# Patient Record
Sex: Female | Born: 1950 | Race: White | Hispanic: No | Marital: Married
Health system: Midwestern US, Academic
[De-identification: ages and names within clinical notes are randomized; demographics above are authoritative.]

---

## 2016-09-17 ENCOUNTER — Ambulatory Visit: Admit: 2016-09-17 | Discharge: 2016-09-17 | Payer: 59

## 2016-09-17 ENCOUNTER — Encounter: Admit: 2016-09-17 | Discharge: 2016-09-17 | Payer: MEDICARE

## 2016-09-17 DIAGNOSIS — J309 Allergic rhinitis, unspecified: ICD-10-CM

## 2016-09-17 DIAGNOSIS — I10 Essential (primary) hypertension: Principal | ICD-10-CM

## 2016-09-17 DIAGNOSIS — Z889 Allergy status to unspecified drugs, medicaments and biological substances status: Principal | ICD-10-CM

## 2016-09-17 DIAGNOSIS — J329 Chronic sinusitis, unspecified: ICD-10-CM

## 2016-09-17 DIAGNOSIS — L508 Other urticaria: ICD-10-CM

## 2016-09-17 DIAGNOSIS — J4540 Moderate persistent asthma, uncomplicated: ICD-10-CM

## 2016-09-17 DIAGNOSIS — K219 Gastro-esophageal reflux disease without esophagitis: ICD-10-CM

## 2016-09-17 MED ORDER — FLUTICASONE 110 MCG/ACTUATION IN HFAA
2 | Freq: Every day | RESPIRATORY_TRACT | 11 refills | Status: AC
Start: 2016-09-17 — End: 2018-01-08

## 2016-09-17 NOTE — Progress Notes
Date of Service: 09/17/2016    Subjective:             Morgan Sharp is a 66 y.o. female with asthma/AERD, recurrent sinus infections, chronic urticaria, allergic rhinitis, and drug allergies who is here for follow up.    History of Present Illness    She had a reaction to PCN in her 3s in which she blacked out.  She has not been off her Zyrtec. If she skips her night time dose, she wakes up all congested and has eye symptoms.  She may consider testing in the winter.    She is taking Zantac 150 mg three times per day for the hives and not taking the Prevacid.  She states she has hives till in her scalp that don't bother her much She had gone a year or two without any, but she is still having them here and there.  She states that she has not had any visible hives on the body and maybe just a little red mark on her scalp. She is also on Zyrtec 10 mg twice daily and Singulair once daily.    She is only using Flovent 1 puff daily and Singulair. She only used albuterol 2 times over the winter that resolved with 1 puff of albuterol. There have been no UC/ER/unplanned PCP visits, nocturnal awakenings, or oral steroid use for asthma since the last visit.   She states she has no activity limitations. She does not have a spacer.  She has not traditionally used one.    She is still taking the Nasacort.  Her sense of smell is good.  She may still have some rhinorrhea, but is doing well with her symptoms.      She will need Pneumovax 09/25/16 as she had Prevnar on 10/25/16.    Rhinitis Control Assessment Test    1. During the past week, how often did you have nasal congestion?  1) Extremely often  2) Often  3) Sometimes  4) Rarely  5) Never    2. During the past week, how often did you sneeze?  1) Extremely often  2) Often  3) Sometimes  4) Rarely  5) Never    3. During the past week, how often did you have watery eyes?  1) Extremely often  2) Often  3) Sometimes  4) Rarely  5) Never 4. During the past week, to what extent did your nasal or other allergy symptoms interfere with your sleep?  1) All the time  2) A lot  3) Somewhat  4) A little  5) Not at all    5. During the past week, how often did you avoid any activities (for example visiting a house with a dog or cat, gardening) because of your nasal or other allergy symptoms?  1) Extremely often  2) Often  3) Sometimes  4) Rarely  5) Never    6. During the past week, how well were your nasal or other allergy symptoms controlled?  1) Not at all  2) A little  3) Somewhat  4) Very  5) Completely    Total Score: 27  A total score of 21 or less may indicate inadequate control of symptoms.     Asthma Control Test for people 12 years and older    1. In the past 4 weeks, how much of the time did your asthma keep you from getting as much done at work, school or at home?    1-  All of the time  2-  Most of the time  3-  Some of the time  4-  A little of the time  5-  None of the time    2. During the past 4 weeks, how often have you had shortness of breath?    1-  More than once a day  2-  Once a day  3-  3-6 times a week  4-  Once or twice a week  5-  Not at all    3. During the past 4 weeks, how often did your asthma symptoms (wheezing, coughing, shortness of breath, chest tightness or pain) wake you up at night or earlier than usual in the morning?    1-  4 or more night a week  2-  2-3 nights a week  3-  Once a week  4-  Once or twice  5-  Not at all    4. During the past 4 weeks, how often have you used your rescue inhaler or nebulizer medication (such as albuterol)?    1-  3 or more times per day  2-  1 or 2 times per day  3-  2 or 3 times per week  4-  Once a week or less  5-  Not at all    5. How would you rate your asthma control during the past 4 weeks?    1- Not controlled at all  2- Poorly controlled  3- Somewhat controlled  4- Well controlled  5- Completely controlled    ACT SCORE = 25 (A score of 19 or less might suggest that the patient's asthma is not as well controlled as it could be)         Review of Systems   HENT: Positive for sinus pressure.    Eyes: Positive for itching.   A 10 point review of systems has been reviewed and the remainder are all negative other than as noted above and as in the HPI.      Objective:         ??? ALBUTEROL SULFATE (VENTOLIN HFA IN) Inhale 1-2 Puffs by mouth as Needed.     ??? Cetirizine (ZYRTEC) 10 mg Cap Take 1 Cap by mouth twice daily.   ??? esomeprazole magnesium (NEXIUM PO) Take  by mouth.   ??? fluticasone (FLOVENT HFA) 110 mcg/actuation inhaler Inhale 2 puffs by mouth into the lungs daily.   ??? lisinopril (PRINIVIL; ZESTRIL) 20 mg tablet Take 20 mg by mouth daily.     ??? montelukast (SINGULAIR) 10 mg tablet TAKE 1 TABLET BY MOUTH EVERY DAY   ??? ranitidine(+) (ZANTAC) 150 mg tablet TAKE 2 TABLETS BY MOUTH TWICE DAILY (Patient taking differently: TAKE 2 TABLETS BY MOUTH TRICE DAILY)   ??? TRIAMCINOLONE ACETONIDE (NASACORT NA) Insert  into nose as directed as Needed.     Vitals:    09/17/16 0857 09/17/16 0904   BP: (!) 139/93 119/84   Pulse: 75    Resp: 14    Temp: 36.8 ???C (98.2 ???F)    TempSrc: Oral    SpO2: 98%    Weight: 86.1 kg (189 lb 14.4 oz)    Height: 159.3 cm (62.72)      Discussed patient's BMI with her.  The body mass index is 33.94 kg/m???. and falls within the category of Obesity 1 (30  to <35); specialist visit only, referred back to Primary Care Provider for follow up.     Physical Exam  Constitutional: She is oriented to person, place, and time. Vital signs are normal. She appears well-developed and well-nourished.   HENT:   Head: Normocephalic and atraumatic.   Right Ear: Tympanic membrane, external ear and ear canal normal.   Left Ear: Tympanic membrane, external ear and ear canal normal.   Nose: Rhinorrhea (clear) present. No mucosal edema.   Mouth/Throat: Oropharynx is clear and moist and mucous membranes are normal. No oropharyngeal exudate. Eyes: Conjunctivae are normal. Right eye exhibits no discharge. Left eye exhibits no discharge. No scleral icterus.   Cardiovascular: Normal rate, regular rhythm and normal heart sounds.  Exam reveals no gallop and no friction rub.    No murmur heard.  Pulmonary/Chest: Effort normal and breath sounds normal. No respiratory distress. She has no wheezes. She has no rales. She exhibits no tenderness.   Neurological: She is alert and oriented to person, place, and time.   Skin: Skin is warm and dry. No rash noted. No erythema.   Psychiatric: She has a normal mood and affect.   Vitals reviewed.            PFT Spirometry 11/13/2013 06/06/2015 10/26/2015 09/17/2016   Spirometry: Condition       FVC (L) 3.9 4.05 3.78 3.37   FVC % Predicted 121 123 116 104   FEV1 (L) 2.51 2.71 2.81 2.33   FEV1 % Predicted 102 112 112 93   FEV1/FVC % 64 70 74 69   FEV1/FVC % Predicted 82 90 96 89   FEF 25-75 (L/sec) 1.34 1.91 2.18 1.54   FEF 25-75 % Predicted 59 85 98 70   ACT Score 25 25 25       Assessment and Plan:    Problem   Asthma, Moderate Persistent, Poorly-Controlled    Has a history of poor control due to history of steroid induced urticaria. Also has AERD, but has hives with ASA and could not get through a desensitization repeatedly.  Also may have had an allergic component as allergy immunotherapy was helpful initially, but then stopped helping her.  She was reversible completely in 2012.    Symptomatically well controlled but spirometry today showed a borderline obstructive defect with a decrease in FEV1 from 2.88 to 2.31 L.    - Will continue Flovent, but increase to 2 puffs once daily.  - Continue Ventolin as needed  - Continue Zyrtec and Singulair     Urticaria, Chronic    Since her 46s, worsens with drug exposure (proven with steroids, ASA, allopurinol).  No significant angioedema associated.  No known eosinophilia.  Autoimmune evaluation negative 08/2010. Still with some pruritis but tolerable and no obvious urticaria on Zantac 150 mg three times per day, Zyrtec twice daily, and Singulair daily.    - Continue current management.      Drug Allergy    Oral steroids cause increase in urticaria 1-2 days after starting the medication: medrol, prednisone, fluticasone (Advair), and budesonide (Symbicort).  02/16/11 - we performed testing to steroids, SPT was positive to undiluted Dexamethasone and Methylprednisolone. ID testing positive to Orapred in 1:10 dilution. It proves she has a true IgE mediated reaction.  - She should continue avoidance, tolerates fluticasone topical.    Allopurinol taken by accident caused anaphylaxis with urticaria.     Excedrin did not cause urticaria, but was told to stop by her allergist at the time. She has never taken any ASA or NSAIDs since her 39s, cannot recall if it affected her asthma.    Tylenol  in 3 gm doses or higher causes urticaria.    PCN in her 29s caused her to black out.  - Will have her return in winter for skin testing.     Allergic Rhinitis    Outside skin testing 03/2004, was on  IT with Dr. Yaakov Guthrie in [place] twice weekly - had been on this for many years.  Her SPT 04/06/04 were positive to (2+ or greater) timothy, orchard, red typ, rye.  Her ID 04/06/04 were positive to mite, feather, Alternaria, Aspergillus, Helminthosporium, Hormodendum, Penicillium, Fusarium, Rhizopus, Pullularia, ragweed, cocklebus, marsh elder, lambs quarters, pigweed, kochia, ash, oak, elm, walnut, hickory, cottonwood, mulberry, birch.    Doing well off IT since June 2012. Good control with Singulair and Zyrtec along with Nasacort since Omnaris is no longer covered.    - Continue current medications.      RTC in fall.    Thank you for allowing [place] to participate in the care of this patient.  Please feel free to contact us if there are any questions or concerns about the patient.    Hyman Bower, DO  Assistant Professor Division of Allergy, Immunology, and Rheumatology  Department of Medicine and Department of Pediatrics  [facility]

## 2016-09-20 ENCOUNTER — Encounter: Admit: 2016-09-20 | Discharge: 2016-09-20 | Payer: MEDICARE

## 2016-10-14 ENCOUNTER — Encounter: Admit: 2016-10-14 | Discharge: 2016-10-14 | Payer: MEDICARE

## 2016-10-15 MED ORDER — MONTELUKAST 10 MG PO TAB
ORAL_TABLET | Freq: Every day | ORAL | 2 refills | 90.00000 days | Status: AC
Start: 2016-10-15 — End: 2017-05-14

## 2016-10-15 NOTE — Telephone Encounter
Pharmacy requesting a refill of Singulair 10 mg. Patient last seen 09/17/16. Follow up scheduled 03/12/17. Per last OV note, "Continue Zyrtec and Singulair." Refilling Singulair per Allergy/Immunology Standing Orders Protocol for 9 months.

## 2016-12-05 ENCOUNTER — Encounter: Admit: 2016-12-05 | Discharge: 2016-12-05 | Payer: MEDICARE

## 2016-12-05 NOTE — Telephone Encounter
Spoke with the pt and scheduled for PCN testing on Nov. 27 at 8:20AM.  Reviewed her medications and the Skin Testing Drug Guidelines.  Pt reports she is taking lisinopril once per day in the mornings.  Will f/u with Dr. Barbette Or regarding this.  Pt verbalized understanding and her questions were answered.      Routing to Dr. Barbette Or for notification of PCN testing appointment.  Would you like her to talk with the prescribing physician to hold lisinopril the morning of the testing?

## 2016-12-05 NOTE — Telephone Encounter
She can continue her lisinopril, no need to hold it for testing.

## 2016-12-06 NOTE — Telephone Encounter
Pt notified and verbalized understanding (ph. [phone]).

## 2017-03-12 ENCOUNTER — Encounter: Admit: 2017-03-12 | Discharge: 2017-03-12 | Payer: MEDICARE

## 2017-03-12 ENCOUNTER — Ambulatory Visit: Admit: 2017-03-12 | Discharge: 2017-03-12 | Payer: MEDICARE

## 2017-03-12 DIAGNOSIS — Z889 Allergy status to unspecified drugs, medicaments and biological substances status: ICD-10-CM

## 2017-03-12 DIAGNOSIS — I10 Essential (primary) hypertension: ICD-10-CM

## 2017-03-12 DIAGNOSIS — J329 Chronic sinusitis, unspecified: ICD-10-CM

## 2017-03-12 DIAGNOSIS — J4540 Moderate persistent asthma, uncomplicated: ICD-10-CM

## 2017-03-12 DIAGNOSIS — L508 Other urticaria: ICD-10-CM

## 2017-03-12 DIAGNOSIS — K219 Gastro-esophageal reflux disease without esophagitis: ICD-10-CM

## 2017-03-12 MED ORDER — OTHER MEDICATION
1 | Freq: Once | 0 refills | Status: CP
Start: 2017-03-12 — End: ?

## 2017-05-14 ENCOUNTER — Encounter: Admit: 2017-05-14 | Discharge: 2017-05-14 | Payer: MEDICARE

## 2017-05-14 MED ORDER — MONTELUKAST 10 MG PO TAB
ORAL_TABLET | Freq: Every day | ORAL | 2 refills | 90.00000 days | Status: AC
Start: 2017-05-14 — End: 2018-03-20

## 2018-01-08 ENCOUNTER — Encounter: Admit: 2018-01-08 | Discharge: 2018-01-08 | Payer: MEDICARE

## 2018-01-08 MED ORDER — FLOVENT HFA 110 MCG/ACTUATION IN HFAA
Freq: Every day | 0 refills | Status: AC
Start: 2018-01-08 — End: 2018-02-17

## 2018-01-29 ENCOUNTER — Encounter: Admit: 2018-01-29 | Discharge: 2018-01-29 | Payer: MEDICARE

## 2018-01-29 MED ORDER — FAMOTIDINE 40 MG PO TAB
40 mg | ORAL_TABLET | Freq: Two times a day (BID) | ORAL | 3 refills | 90.00000 days | Status: AC
Start: 2018-01-29 — End: ?

## 2018-01-29 MED ORDER — FAMOTIDINE 40 MG PO TAB
40 mg | ORAL_TABLET | Freq: Every day | ORAL | 3 refills | 90.00000 days | Status: AC
Start: 2018-01-29 — End: 2018-01-29

## 2018-02-17 ENCOUNTER — Encounter: Admit: 2018-02-17 | Discharge: 2018-02-17 | Payer: MEDICARE

## 2018-02-17 ENCOUNTER — Ambulatory Visit: Admit: 2018-02-17 | Discharge: 2018-02-18 | Payer: MEDICARE

## 2018-02-17 DIAGNOSIS — Z889 Allergy status to unspecified drugs, medicaments and biological substances status: ICD-10-CM

## 2018-02-17 DIAGNOSIS — I10 Essential (primary) hypertension: Principal | ICD-10-CM

## 2018-02-17 DIAGNOSIS — J309 Allergic rhinitis, unspecified: Secondary | ICD-10-CM

## 2018-02-17 DIAGNOSIS — K219 Gastro-esophageal reflux disease without esophagitis: ICD-10-CM

## 2018-02-17 DIAGNOSIS — J329 Chronic sinusitis, unspecified: ICD-10-CM

## 2018-02-17 DIAGNOSIS — J4540 Moderate persistent asthma, uncomplicated: Secondary | ICD-10-CM

## 2018-02-17 DIAGNOSIS — J339 Nasal polyp, unspecified: Secondary | ICD-10-CM

## 2018-02-17 MED ORDER — FLUTICASONE PROPIONATE 110 MCG/ACTUATION IN HFAA
2 | Freq: Every day | RESPIRATORY_TRACT | 3 refills | Status: AC
Start: 2018-02-17 — End: 2019-07-20

## 2018-02-18 DIAGNOSIS — J329 Chronic sinusitis, unspecified: ICD-10-CM

## 2018-02-18 DIAGNOSIS — Z886 Allergy status to analgesic agent status: ICD-10-CM

## 2018-02-18 DIAGNOSIS — J45909 Unspecified asthma, uncomplicated: Principal | ICD-10-CM

## 2018-02-18 DIAGNOSIS — L508 Other urticaria: ICD-10-CM

## 2018-02-18 DIAGNOSIS — Z23 Encounter for immunization: Secondary | ICD-10-CM

## 2018-02-18 DIAGNOSIS — K219 Gastro-esophageal reflux disease without esophagitis: ICD-10-CM

## 2018-03-20 ENCOUNTER — Encounter: Admit: 2018-03-20 | Discharge: 2018-03-20 | Payer: MEDICARE

## 2018-03-20 MED ORDER — MONTELUKAST 10 MG PO TAB
10 mg | ORAL_TABLET | Freq: Every day | ORAL | 3 refills | 90.00000 days | Status: AC
Start: 2018-03-20 — End: 2018-04-10

## 2018-04-10 ENCOUNTER — Encounter: Admit: 2018-04-10 | Discharge: 2018-04-10 | Payer: MEDICARE

## 2018-04-10 MED ORDER — MONTELUKAST 10 MG PO TAB
ORAL_TABLET | Freq: Every day | ORAL | 3 refills | 90.00000 days | Status: AC
Start: 2018-04-10 — End: ?

## 2019-02-16 ENCOUNTER — Encounter: Admit: 2019-02-16 | Discharge: 2019-02-16 | Payer: MEDICARE

## 2019-02-16 ENCOUNTER — Ambulatory Visit: Admit: 2019-02-16 | Discharge: 2019-02-16 | Payer: MEDICARE

## 2019-02-16 DIAGNOSIS — J329 Chronic sinusitis, unspecified: Secondary | ICD-10-CM

## 2019-02-16 DIAGNOSIS — Z889 Allergy status to unspecified drugs, medicaments and biological substances status: Secondary | ICD-10-CM

## 2019-02-16 DIAGNOSIS — J4540 Moderate persistent asthma, uncomplicated: Secondary | ICD-10-CM

## 2019-02-16 DIAGNOSIS — J309 Allergic rhinitis, unspecified: Secondary | ICD-10-CM

## 2019-02-16 DIAGNOSIS — I10 Essential (primary) hypertension: Secondary | ICD-10-CM

## 2019-02-16 DIAGNOSIS — J45909 Unspecified asthma, uncomplicated: Secondary | ICD-10-CM

## 2019-02-16 DIAGNOSIS — L508 Other urticaria: Secondary | ICD-10-CM

## 2019-02-16 DIAGNOSIS — K219 Gastro-esophageal reflux disease without esophagitis: Secondary | ICD-10-CM

## 2019-02-16 NOTE — Progress Notes
Date of Service: 02/16/2019  Date of last contact with Allergy/Immunology: 04/10/2018  Date of last office visit encounter with Dr. Barbette Or: 02/17/2018       Subjective:  I had the pleasure of seeing Morgan Sharp who presents today to the [facility] for a follow up visit.  As you know, she is a 68 y.o. female with asthma/AERD, recurrent sinus infections, chronic urticaria,?allergic rhinitis, and drug allergies.     Chief Complaint   Patient presents with   ? Asthma       History of Present Illness:     Asthma: She is currently on Flovent 2 puffs once a day. She denies any day time symptoms or night time awakening. No ER/UC visits for asthma.     Urticaria: She is on Zyrtec 10 mg twice daily and Pepcid 20 mg TID. No breakthrough episodes of hives. She does occasionally has spots of pruritus behind her ears but it is intermittent and resolves by itself.     Allergic rhinitis: She reports that her nasal symptoms are overall well controlled on Flonase 1 SPEN once day. No nosebleeds with use of her nasal spray. She did notice an increase in her nasal symptoms (nasal congestion, rhinorrhea) during the Fall season. She reports that [place] She denies having any itchy watery eye symptoms.     No recent infections or antibiotic use. She denies any NSAID exposure.     There were no new changes to the patient's medical history, surgical history, family medical and social/environmental history.       Rhinitis Control Assessment Test  #1. During the past week, how often did you have nasal congestion?: 3-Sometimes (02/16/2019 10:00 AM)  #2. During the past week, how often did you sneeze?: 4-Rarely (02/16/2019 10:00 AM)  #3. During the past week, how often did you have watery eyes?: 5-Never (02/16/2019 10:00 AM)  #4. During the past week, to what extent did your nasal or other allergy symptoms interfere with your sleep?: 5-Not at all (02/16/2019 10:00 AM) #5. During the past week, how well were your nasal or other allergy symptoms controlled?: 5-Completely (02/16/2019 10:00 AM)  #6. During the past week, how often did you avoid any activities (for example, visiting a house with a dog or cat, or gardening) because of your nasal or other allergy symptoms?: 5-Never (02/16/2019 10:00 AM)  RCAT Total Score: 27 (02/16/2019 10:00 AM)      Asthma Control Test  #1. In the past 4 weeks, how much of the time did your asthma keep you from getting as much done at work, school or at home?: 5-None of the time (02/16/2019 10:00 AM)  #2. During the past 4 weeks, how often have you had shortness of breath?: 5-Not at all (02/16/2019 10:00 AM)  #3. During the past 4 weeks, how often did your asthma symptoms (wheezing, coughing, shortness of breath, chest tightness or pain) wake you up at night or earlier than usual in the morning?: 5-Not at all (02/16/2019 10:00 AM)  #4. During the past 4 weeks, how often have you used your rescue inhaler or nebulizer medication (such as albuterol)?: 5-Not at all (02/16/2019 10:00 AM)  #5. How would you rate your asthma control during the past 4 weeks?: 5-Completely controlled (02/16/2019 10:00 AM)  Total Score: 25 (02/16/2019 10:00 AM)                Review of Systems  Constitutional: Negative for activity change, chills, fatigue and fever.   HENT: Negative for congestion, ear discharge, ear pain, nosebleeds, postnasal drip, rhinorrhea, sinus pressure, sinus pain, sneezing, sore throat and trouble swallowing.    Eyes: Negative for pain, discharge, redness and itching.   Respiratory: Negative for cough, chest tightness, shortness of breath and wheezing.    Cardiovascular: Negative for chest pain and palpitations.   Gastrointestinal: Negative for abdominal pain, constipation, diarrhea and nausea.   Musculoskeletal: Negative for arthralgias, back pain, joint swelling, myalgias, neck pain and neck stiffness.   Skin: Negative for color change and rash. Allergic/Immunologic: Positive for environmental allergies. Negative for food allergies.   Neurological: Negative for dizziness, weakness, numbness and headaches.   Psychiatric/Behavioral: Negative for agitation and behavioral problems.       A 10 point review of systems has been reviewed and the remainder are all negative other than as noted above and as in the HPI.  Any chronic issues are being addressed by a variety of physicians other than as is noted in the HPI.    Objective:         Medications  ? ALBUTEROL SULFATE (VENTOLIN HFA IN) Inhale 1-2 Puffs by mouth as Needed.     ? atorvastatin (LIPITOR) 10 mg tablet Take 10 mg by mouth daily.   ? Cetirizine (ZYRTEC) 10 mg Cap Take 1 Cap by mouth twice daily.   ? esomeprazole magnesium (NEXIUM PO) Take  by mouth.   ? famotidine(+) (PEPCID) 40 mg tablet Take one tablet by mouth twice daily.   ? fluticasone propionate (FLOVENT HFA) 110 mcg/actuation inhaler Inhale two puffs by mouth into the lungs daily.   ? lisinopril (PRINIVIL; ZESTRIL) 20 mg tablet Take 20 mg by mouth daily.     ? montelukast (SINGULAIR) 10 mg tablet TAKE 1 TABLET BY MOUTH EVERY DAY   ? TRIAMCINOLONE ACETONIDE (NASACORT NA) Insert  into nose as directed as Needed.     Allergies   Allergen Reactions   ? Allopurinol ANAPHYLAXIS   ? Advair Diskus [Fluticasone Propion-Salmeterol] HIVES   ? Aspirin HIVES and SEE COMMENTS     Respiratory Distress   ? Nsaids (Non-Steroidal Anti-Inflammatory Drug) HIVES   ? Pcn [Penicillins] SYNCOPE     Black-out in her 29s.   ? Prednisone HIVES     Proven by skin testing.   ? Symbicort [Budesonide-Formoterol] HIVES     Vitals:    02/16/19 0956   BP: 137/82   BP Source: Arm, Left Upper   Patient Position: Sitting   Pulse: 80   Resp: 15   Temp: 36.5 ?C (97.7 ?F)   TempSrc: Skin   SpO2: 97%   Weight: 86.2 kg (190 lb)   Height: 159.3 cm (62.72)     The patient's BP is elevated and, because we are specialists not managing elevated BP, the patient is referred back to Primary Care Provider for follow up of this problem.        Physical Exam  Vitals signs reviewed.   Constitutional:       General: She is not in acute distress.     Appearance: Normal appearance. She is not ill-appearing.   HENT:      Head: Normocephalic and atraumatic.      Right Ear: Tympanic membrane, ear canal and external ear normal.      Left Ear: Tympanic membrane, ear canal and external ear normal.      Nose: Nose normal. No congestion or rhinorrhea.  Mouth/Throat:      Mouth: Mucous membranes are moist.      Pharynx: Oropharynx is clear. No oropharyngeal exudate or posterior oropharyngeal erythema.   Eyes:      General:         Right eye: No discharge.         Left eye: No discharge.      Extraocular Movements: Extraocular movements intact.      Conjunctiva/sclera: Conjunctivae normal.      Pupils: Pupils are equal, round, and reactive to light.   Neck:      Musculoskeletal: Normal range of motion and neck supple.   Cardiovascular:      Rate and Rhythm: Normal rate and regular rhythm.      Pulses: Normal pulses.      Heart sounds: Normal heart sounds. No murmur.   Pulmonary:      Effort: Pulmonary effort is normal. No respiratory distress.      Breath sounds: Normal breath sounds. No wheezing.   Musculoskeletal: Normal range of motion.   Skin:     General: Skin is warm and dry.      Capillary Refill: Capillary refill takes less than 2 seconds.   Neurological:      General: No focal deficit present.      Mental Status: She is alert and oriented to person, place, and time.   Psychiatric:         Mood and Affect: Mood normal.         Behavior: Behavior normal.                        Assessment and Plan:  Problem   Drug Allergy    No accidental exposures. She continues to avoid steroids, PCN, NSAIDs.     Plan:   - Continue avoidance of steroids, PCN, NSAIDs, ASA.       Moderate Asthma Without Complication Has a history of poor control due to history of steroid induced urticaria. Also has AERD, but has hives with ASA and could not get through a desensitization repeatedly.  Also may have had an allergic component as allergy immunotherapy was helpful initially, but then stopped helping her.  She was reversible completely in 2012.    Symptomatically well controlled on current regimen with Flovent 2 puffs once a day. No Albuterol use since last visit.     02/16/19 spirometry was within normal limits.    Plan:   - Continue Flovent 2 puffs once daily.   - Continue albuterol 2 puffs every 4 hours as needed for asthma exacerbations.   - Continue Singulair.         Aspirin-Sensitive Asthma With Nasal Polyps    Suspect aspirin-exacerbated respiratory disease with the onset of asthma in 3rd decade of life, nasal polyps, and recurrent sinus infections.  Patient failed  3 different protocols of ASA desensitization on 11/03/10; 11/06/10; 11/08/10.  At this point we can't offer other way of desensitizations.    No accidental exposure to ASA.     Plan:   - Continue ASA avoidance, if she has to have it in the future, would consider pre-treatment with Xolair.       Urticaria, Chronic    No breakthrough hives on current regimen. She still has some pruritis but is tolerable.     Plan:   - Continue on Zyrtec 10 mg BID.   - Continue on Pepcid 20 mg TID.   - Continue on  Singulair 10 mg daily.    Recurrent Sinus Infections    History of nasal polyposis, possibly ASA sensitivity, concerning for AERD.  Previous labs reviewed: IgG, IgM, IgA within normal limits. IgE elevated at 192. No eosinophilia.   Immunodeficiency evaluation was normal and ESR/CRP/ANCAs were also negative 08/2010.      No infections since last clinic visit. Last infection was in October 2019.     Plan:   - Continue Zyrtec, Singulair and Flonase.     Allergic Rhinitis    04/06/04 - outside skin testing (Dr. Yaakov Guthrie) was positive to (2+ or greater) timothy, orchard, red typ, rye.  Her ID 04/06/04 were positive to mite, feather, Alternaria, Aspergillus, Helminthosporium, Hormodendum, Penicillium, Fusarium, Rhizopus, Pullularia, ragweed, cocklebus, marsh elder, lambs quarters, pigweed, kochia, ash, oak, elm, walnut, hickory, cottonwood, mulberry, birch.  AIT given twice weekly for many years.  Doing well off IT since June 2012.      Symptoms well controlled on current regimen.     Plan:   - Continue Flonase 1 SPEN once daily. Point nozzle outwards to help prevent nosebleeds.  - Continue on Singulair and Zyrtec.                     RTC in 1 year.  Thank you for allowing [place] to participate in this patient's care. Patient was seen and discussed with Dr. Barbette Or. Please feel free to contact us with any questions or concerns.    Greer Pickerel, MD  Allergy/Immunology Fellow  Division of Allergy, Immunology, and Rheumatology  Department of Internal Medicine and Department of Pediatrics  [facility]             [place] Primary Allergist: Dr. Barbette Or at [facility]

## 2019-02-20 ENCOUNTER — Encounter: Admit: 2019-02-20 | Discharge: 2019-02-20 | Payer: MEDICARE

## 2019-02-20 DIAGNOSIS — J4540 Moderate persistent asthma, uncomplicated: Secondary | ICD-10-CM

## 2019-02-20 DIAGNOSIS — Z889 Allergy status to unspecified drugs, medicaments and biological substances status: Secondary | ICD-10-CM

## 2019-02-20 DIAGNOSIS — K219 Gastro-esophageal reflux disease without esophagitis: Secondary | ICD-10-CM

## 2019-02-20 DIAGNOSIS — J329 Chronic sinusitis, unspecified: Secondary | ICD-10-CM

## 2019-02-20 DIAGNOSIS — I10 Essential (primary) hypertension: Secondary | ICD-10-CM

## 2019-07-14 ENCOUNTER — Encounter: Admit: 2019-07-14 | Discharge: 2019-07-14 | Payer: MEDICARE

## 2019-07-14 MED ORDER — FLOVENT HFA 110 MCG/ACTUATION IN HFAA
2 | Freq: Every day | RESPIRATORY_TRACT | 3 refills | Status: AC
Start: 2019-07-14 — End: ?

## 2019-07-14 MED ORDER — FAMOTIDINE 40 MG PO TAB
40 mg | ORAL_TABLET | Freq: Two times a day (BID) | ORAL | 3 refills | Status: CN
Start: 2019-07-14 — End: ?

## 2019-07-20 MED ORDER — FAMOTIDINE 20 MG PO TAB
20 mg | ORAL_TABLET | Freq: Three times a day (TID) | ORAL | 11 refills | 90.00000 days | Status: AC
Start: 2019-07-20 — End: ?

## 2020-02-16 ENCOUNTER — Encounter: Admit: 2020-02-16 | Discharge: 2020-02-16 | Payer: MEDICARE

## 2020-02-16 ENCOUNTER — Ambulatory Visit: Admit: 2020-02-16 | Discharge: 2020-02-16 | Payer: MEDICARE

## 2020-02-16 DIAGNOSIS — J329 Chronic sinusitis, unspecified: Secondary | ICD-10-CM

## 2020-02-16 DIAGNOSIS — L508 Other urticaria: Secondary | ICD-10-CM

## 2020-02-16 DIAGNOSIS — J45909 Unspecified asthma, uncomplicated: Secondary | ICD-10-CM

## 2020-02-16 DIAGNOSIS — I10 Essential (primary) hypertension: Secondary | ICD-10-CM

## 2020-02-16 DIAGNOSIS — Z889 Allergy status to unspecified drugs, medicaments and biological substances status: Secondary | ICD-10-CM

## 2020-02-16 DIAGNOSIS — J4540 Moderate persistent asthma, uncomplicated: Secondary | ICD-10-CM

## 2020-02-16 DIAGNOSIS — K219 Gastro-esophageal reflux disease without esophagitis: Secondary | ICD-10-CM

## 2020-02-16 MED ORDER — VENTOLIN HFA 90 MCG/ACTUATION IN HFAA
2 | RESPIRATORY_TRACT | 1 refills | Status: AC | PRN
Start: 2020-02-16 — End: ?

## 2020-02-22 ENCOUNTER — Encounter: Admit: 2020-02-22 | Discharge: 2020-02-22 | Payer: MEDICARE

## 2020-02-22 DIAGNOSIS — K219 Gastro-esophageal reflux disease without esophagitis: Secondary | ICD-10-CM

## 2020-02-22 DIAGNOSIS — Z889 Allergy status to unspecified drugs, medicaments and biological substances status: Secondary | ICD-10-CM

## 2020-02-22 DIAGNOSIS — J4540 Moderate persistent asthma, uncomplicated: Secondary | ICD-10-CM

## 2020-02-22 DIAGNOSIS — I10 Essential (primary) hypertension: Secondary | ICD-10-CM

## 2020-02-22 DIAGNOSIS — J329 Chronic sinusitis, unspecified: Secondary | ICD-10-CM

## 2020-09-24 ENCOUNTER — Encounter: Admit: 2020-09-24 | Discharge: 2020-09-24 | Payer: MEDICARE

## 2020-09-24 MED ORDER — FLOVENT HFA 110 MCG/ACTUATION IN HFAA
Freq: Every day | 0 refills
Start: 2020-09-24 — End: ?

## 2021-01-02 ENCOUNTER — Ambulatory Visit: Admit: 2021-01-02 | Discharge: 2021-01-02 | Payer: MEDICARE

## 2021-01-02 ENCOUNTER — Encounter: Admit: 2021-01-02 | Discharge: 2021-01-02 | Payer: MEDICARE

## 2021-01-02 DIAGNOSIS — J309 Allergic rhinitis, unspecified: Secondary | ICD-10-CM

## 2021-01-02 DIAGNOSIS — J45909 Unspecified asthma, uncomplicated: Principal | ICD-10-CM

## 2021-01-02 DIAGNOSIS — K219 Gastro-esophageal reflux disease without esophagitis: Secondary | ICD-10-CM

## 2021-01-02 DIAGNOSIS — J329 Chronic sinusitis, unspecified: Secondary | ICD-10-CM

## 2021-01-02 DIAGNOSIS — Z889 Allergy status to unspecified drugs, medicaments and biological substances status: Secondary | ICD-10-CM

## 2021-01-02 DIAGNOSIS — L508 Other urticaria: Secondary | ICD-10-CM

## 2021-01-02 NOTE — Progress Notes
Date of Service: 01/02/2021    Subjective:             Morgan Sharp is a 70 y.o. female who presents for follow up of asthma, GERD, urticaria, recurrent sinus infections, and AERD.     History of Present Illness  Asthma:  States her symptoms are fully controlled on Flovent 110 2 puffs daily. She has not needed any rescue albuterol. No ER/UC visits. No daily or nighttime symptoms. Last spirometry was 02/2020 and normal.    Urticaria:  Has not had any episodes of hives in the last year. She continues on Zyrtec 10mg  twice daily, Famotidine 40mg  TID, and Singulair 10mg  daily.    GERD:  Sx well controlled on Famotidine.    Sinus infections:  No infections or antibiotic courses in the last year. She is not needed nasal rinses and not having any drainage.     AERD:  She continues to avoid aspirin and NSAIDs.        Review of Systems  A 10 point review of systems has been reviewed and the remainder are all negative other than as noted above and as in the HPI.      Objective:         ? atorvastatin (LIPITOR) 10 mg tablet Take 10 mg by mouth daily.   ? Cetirizine (ZYRTEC) 10 mg Cap Take 1 Cap by mouth twice daily.   ? esomeprazole magnesium (NEXIUM PO) Take  by mouth.   ? famotidine(+) (PEPCID) 40 mg tablet Take one tablet by mouth twice daily. (Patient taking differently: Take 40 mg by mouth three times daily.)   ? FLOVENT HFA 110 mcg/actuation inhaler INHALE 2 PUFFS BY MOUTH ONCE DAILY   ? fluticasone propionate (FLONASE) 50 mcg/actuation nasal spray, suspension Apply 2 sprays to each nostril as directed daily. Shake bottle gently before using.   ? lisinopril (PRINIVIL; ZESTRIL) 20 mg tablet Take 20 mg by mouth daily.     ? montelukast (SINGULAIR) 10 mg tablet TAKE 1 TABLET BY MOUTH EVERY DAY   ? TRIAMCINOLONE ACETONIDE (NASACORT NA) Insert  into nose as directed as Needed.   ? VENTOLIN HFA 90 mcg/actuation inhaler Inhale two puffs by mouth into the lungs every 4 hours as needed.     Vitals:    01/02/21 1108 BP: 127/73   Pulse: 83   SpO2: 99%     There is no height or weight on file to calculate BMI.     Physical Exam  Constitutional:       General: She is not in acute distress.  HENT:      Head: Normocephalic.      Right Ear: External ear normal.      Left Ear: External ear normal.      Nose: Nose normal. No congestion or rhinorrhea.      Mouth/Throat:      Pharynx: No posterior oropharyngeal erythema.   Eyes:      General:         Right eye: No discharge.         Left eye: No discharge.      Conjunctiva/sclera: Conjunctivae normal.   Cardiovascular:      Rate and Rhythm: Normal rate and regular rhythm.      Heart sounds: Normal heart sounds.   Pulmonary:      Effort: Pulmonary effort is normal. No respiratory distress.      Breath sounds: Normal breath sounds. No wheezing or rales.  Musculoskeletal:         General: Normal range of motion.      Cervical back: Normal range of motion.   Skin:     General: Skin is warm.      Findings: No rash.   Neurological:      General: No focal deficit present.      Mental Status: She is alert.   Psychiatric:         Thought Content: Thought content normal.              Assessment and Plan:    Problem   Gerd (Gastroesophageal Reflux Disease)    Currently fairly well controlled on Pepcid 40 mg thrice daily and also alleviated by Tums.     Drug Allergy    Oral steroids cause increase in urticaria 1-2 days after starting the medication: medrol, prednisone, fluticasone (Advair), and budesonide (Symbicort).  02/16/11 - we performed testing to steroids, SPT was positive to undiluted Dexamethasone and Methylprednisolone. ID testing positive to Orapred in 1:10 dilution. It proves she has a true IgE mediated reaction.  - She should continue avoidance, tolerates fluticasone topical.    Allopurinol taken by accident caused anaphylaxis with urticaria.     Excedrin did not cause urticaria, but was told to stop by her allergist at the time. She has never taken any ASA or NSAIDs since her 59s, cannot recall if it affected her asthma.    Tylenol in 3 gm doses or higher causes urticaria.    PCN in her 72s caused her to black out, and on a second event had hives 24-48 hours after her first dose of penicillin.   03/12/17 - penicillin testing had negative SPT results but positive intradermal testing to 2 out of 3 amoxicillin intradermals placed and she broke out in hives. We did not proceed with oral challenge. If the patient ever requires penicillin in the future, we will perform drug desensitization.     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 Also has AERD, but has hives with ASA and could not get through a desensitization repeatedly.  Also may have had an allergic component as allergy immunotherapy was helpful initially, but then stopped helping her.  She was reversible completely in 2012.    02/16/20: symptomatically well controlled on current regimen with Flovent 2 puffs once a day and singular 10 mg once daily. No Albuterol use since last visit.   01/02/21: Symptoms are completely controlled on Flovent 2 puffs daily and Singulair 10mg  daily. No albuterol use in the last year.    Spirometry 01/02/21: Normal     Plan:  - Continue Flovent 2 puffs daily  - Albuterol PRN, please let us know if needed <2x/week or doses not lasting 4 hours     Aspirin-Sensitive Asthma With Nasal Polyps    Suspect aspirin-exacerbated respiratory disease with the onset of asthma in 3rd decade of life, nasal polyps, and recurrent sinus infections.  Patient failed  3 different protocols of ASA desensitization on 11/03/10; 11/06/10; 11/08/10.  At this point we can't offer other way of desensitizations.  Strict ASA avoidance is recommended.    No accidental exposures to ASA since last visit.     Plan:   - Continue ASA avoidance, if she has to have it in the future, would consider pre-treatment with Xolair.  Urticaria, Chronic    Since her 13s, worsens with drug exposure (proven with steroids, ASA, allopurinol, and large amounts of Tylenol).  No significant angioedema associated.  No known eosinophilia.  Autoimmune evaluation negative 08/2010.     No breakthrough hives on Zyrtec 10 mg BID, Pepcid 40 mg TID (also needed for reflux), and Singulair 10 mg daily. She is slightly hesitant to make changes to this regimen as she is nervous about recurrence but is willing to try.     Plan:  Continue current regimen  - She can consider decreasing her Famotidine dose to 40mg  BID and see how she does with this. If she continues to be hive free, she can further decrease her Famotidine to daily.        Recurrent Sinus Infections    History of nasal polyposis, possibly ASA sensitivity, concerning for AERD.  Previous labs reviewed: IgG, IgM, IgA within normal limits. IgE elevated at 192. No eosinophilia.   Immunodeficiency evaluation was normal and ESR/CRP/ANCAs were also negative 08/2010.    No infections since last clinic visit. Last infection was in October 2019.     Currently symptomatically well controlled on Zyrtec, Singulair, and Flonase.  Currently not using saline irrigation.       Allergic Rhinitis    04/06/04 - outside skin testing (Dr. Yaakov Guthrie) was positive to (2+ or greater) timothy, orchard, red typ, rye.  Her ID 04/06/04 were positive to mite, feather, Alternaria, Aspergillus, Helminthosporium, Hormodendum, Penicillium, Fusarium, Rhizopus, Pullularia, ragweed, cocklebus, marsh elder, lambs quarters, pigweed, kochia, ash, oak, elm, walnut, hickory, cottonwood, mulberry, birch.  AIT given twice weekly for many years.    Doing well off IT since June 2012.     Symptoms well controlled on current regimen: Flonase 2 SPEN daily, Singulair, and Zyrtec.     Plan:   - Continue current management.              Follow up in 1 year.     The patient was seen and discussed with Dr. Barbette Or.    Lucillie Garfinkel, MD  Allergy & Immunology PGY-4

## 2021-01-08 ENCOUNTER — Encounter: Admit: 2021-01-08 | Discharge: 2021-01-08 | Payer: MEDICARE

## 2021-01-08 DIAGNOSIS — I10 Essential (primary) hypertension: Secondary | ICD-10-CM

## 2021-01-08 DIAGNOSIS — J4540 Moderate persistent asthma, uncomplicated: Secondary | ICD-10-CM

## 2021-01-08 DIAGNOSIS — J329 Chronic sinusitis, unspecified: Secondary | ICD-10-CM

## 2021-01-08 DIAGNOSIS — K219 Gastro-esophageal reflux disease without esophagitis: Secondary | ICD-10-CM

## 2021-01-08 DIAGNOSIS — Z889 Allergy status to unspecified drugs, medicaments and biological substances status: Secondary | ICD-10-CM

## 2021-02-06 ENCOUNTER — Ambulatory Visit: Admit: 2021-02-06 | Discharge: 2021-02-06 | Payer: MEDICARE

## 2021-02-06 ENCOUNTER — Encounter: Admit: 2021-02-06 | Discharge: 2021-02-06 | Payer: MEDICARE

## 2021-02-06 DIAGNOSIS — L508 Other urticaria: Secondary | ICD-10-CM

## 2021-02-06 DIAGNOSIS — L233 Allergic contact dermatitis due to drugs in contact with skin: Secondary | ICD-10-CM

## 2021-02-06 MED ORDER — FLUTICASONE PROPIONATE 0.005 % TP OINT
Freq: Two times a day (BID) | TOPICAL | 1 refills | Status: AC
Start: 2021-02-06 — End: ?

## 2021-02-06 MED ORDER — FLUTICASONE PROPIONATE 0.005 % TP OINT
Freq: Two times a day (BID) | TOPICAL | 1 refills | Status: DC
Start: 2021-02-06 — End: 2021-02-06

## 2021-02-06 NOTE — Telephone Encounter
Received VM from pt. Returned call. Pt had a biopsy on thyroid Wednesday 10/19. Pt began to develop hives on Friday 10/21. Body still covered in hives. Scheduled in person appt to see Dr. Barbette Or today 220p

## 2021-02-07 ENCOUNTER — Encounter: Admit: 2021-02-07 | Discharge: 2021-02-07 | Payer: MEDICARE

## 2021-02-07 DIAGNOSIS — J4540 Moderate persistent asthma, uncomplicated: Secondary | ICD-10-CM

## 2021-02-07 DIAGNOSIS — I10 Essential (primary) hypertension: Secondary | ICD-10-CM

## 2021-02-07 DIAGNOSIS — K219 Gastro-esophageal reflux disease without esophagitis: Secondary | ICD-10-CM

## 2021-02-07 DIAGNOSIS — Z889 Allergy status to unspecified drugs, medicaments and biological substances status: Secondary | ICD-10-CM

## 2021-02-07 DIAGNOSIS — J329 Chronic sinusitis, unspecified: Secondary | ICD-10-CM

## 2021-02-08 ENCOUNTER — Encounter: Admit: 2021-02-08 | Discharge: 2021-02-08 | Payer: MEDICARE

## 2021-02-08 NOTE — Telephone Encounter
Patches ordered from [facility] to be delivered 11/30, order #[id number]    Order requisition entered on [employer], [id number]

## 2021-02-08 NOTE — Telephone Encounter
Spoke with patient about scheduling patch testing 4-6 weeks from her visit on 02/06/21.    Patient is scheduled on 12/5 at 10 for placement, 12/7 at 10 for removal and 12/9 at 1 for final reading.

## 2021-02-10 ENCOUNTER — Encounter: Admit: 2021-02-10 | Discharge: 2021-02-10 | Payer: MEDICARE

## 2021-02-10 ENCOUNTER — Ambulatory Visit: Admit: 2021-02-10 | Discharge: 2021-02-10 | Payer: MEDICARE

## 2021-02-10 MED ORDER — CLOBETASOL 0.05 % TP OINT
Freq: Two times a day (BID) | TOPICAL | 1 refills | Status: DC
Start: 2021-02-10 — End: 2021-02-10

## 2021-02-10 MED ORDER — CLOBETASOL 0.05 % TP OINT
Freq: Two times a day (BID) | TOPICAL | 1 refills | Status: AC
Start: 2021-02-10 — End: ?

## 2021-02-10 NOTE — Telephone Encounter
She is not having any trouble breathing. She will come into clinic now. She is using the fluticasone. The calamine was minimally helpful.

## 2021-02-10 NOTE — Telephone Encounter
Received VM from patient and message from patient both stating    I am twice as bad since I saw you on Monday-my left eye is very swollen and face is swollen and has reddish purple welts   Most of my body is covered now  I really dont think the ointment is helping much  Ive tried Benadryl-extra Zyrtec-calamine lotion-  A cool shower helps for a half hour or so but now my body is sooo dry it aches  Any other suggestions?    Routed patient message to Dr. Barbette Or with [facility].

## 2021-02-10 NOTE — Telephone Encounter
Regarding: Medication  ----- Message from Ollen Bowl, RN sent at 02/10/2021 12:31 PM CDT -----       ----- Message from Morgan Stai "Peggy" to [name], DO sent at 02/10/2021 12:27 PM -----   I am twice as bad since I saw you on Monday-my left eye is very swollen and face is swollen and has reddish purple welts   Most of my body is covered now  I really dont think the ointment is helping much  Ive tried Benadryl-extra Zyrtec-calamine lotion-  A cool shower helps for a half hour or so but now my body is sooo dry it aches  Any other suggestions?      ----- Message -----       From:Morgan Sharp       Sent:02/09/2021  6:54 AM CDT         ZD:GLOVFI A [name], DO    Subject:Medication    I am worse! The only part of my body not covered are my calves and my feet my left eye is swollen almost shut this morning . I got  up twice during the night and took cool showers.  Any suggestions are welcome!        ----- Message -----       From:[name], DO       Sent:02/07/2021  7:52 PM CDT         EP:PIRJJOAC A [name]    Subject:Medication    Hey - were you able to get your steroids? If not, I think we might be able to try something else.  [name], DO

## 2021-02-12 ENCOUNTER — Encounter: Admit: 2021-02-12 | Discharge: 2021-02-12 | Payer: MEDICARE

## 2021-02-12 DIAGNOSIS — K219 Gastro-esophageal reflux disease without esophagitis: Secondary | ICD-10-CM

## 2021-02-12 DIAGNOSIS — J4540 Moderate persistent asthma, uncomplicated: Secondary | ICD-10-CM

## 2021-02-12 DIAGNOSIS — J329 Chronic sinusitis, unspecified: Secondary | ICD-10-CM

## 2021-02-12 DIAGNOSIS — I10 Essential (primary) hypertension: Secondary | ICD-10-CM

## 2021-02-12 DIAGNOSIS — Z889 Allergy status to unspecified drugs, medicaments and biological substances status: Secondary | ICD-10-CM

## 2021-03-20 ENCOUNTER — Ambulatory Visit: Admit: 2021-03-20 | Discharge: 2021-03-20 | Payer: MEDICARE

## 2021-03-20 ENCOUNTER — Encounter: Admit: 2021-03-20 | Discharge: 2021-03-20 | Payer: MEDICARE

## 2021-03-20 DIAGNOSIS — L233 Allergic contact dermatitis due to drugs in contact with skin: Secondary | ICD-10-CM

## 2021-03-22 ENCOUNTER — Ambulatory Visit: Admit: 2021-03-22 | Discharge: 2021-03-22 | Payer: MEDICARE

## 2021-03-22 ENCOUNTER — Encounter: Admit: 2021-03-22 | Discharge: 2021-03-22 | Payer: MEDICARE

## 2021-03-22 NOTE — Progress Notes
Pt presents to clinic for patch removal.  Hypafix tape and panel 1 removed; skin marker used to label locations of panels and allergens.  Pt waited 30 minutes prior to the initial reading.  Initial reading results documented below.  Provided pt with skin care instructions until the final reading with physician on Friday 12/9.  Pt verbalized understanding and questions were answered.

## 2021-03-24 ENCOUNTER — Encounter: Admit: 2021-03-24 | Discharge: 2021-03-24 | Payer: MEDICARE

## 2021-03-24 ENCOUNTER — Ambulatory Visit: Admit: 2021-03-24 | Discharge: 2021-03-24 | Payer: MEDICARE

## 2021-03-24 DIAGNOSIS — J329 Chronic sinusitis, unspecified: Secondary | ICD-10-CM

## 2021-03-24 DIAGNOSIS — Z888 Allergy status to other drugs, medicaments and biological substances status: Secondary | ICD-10-CM

## 2021-03-24 DIAGNOSIS — L233 Allergic contact dermatitis due to drugs in contact with skin: Secondary | ICD-10-CM

## 2021-03-24 DIAGNOSIS — Z889 Allergy status to unspecified drugs, medicaments and biological substances status: Secondary | ICD-10-CM

## 2021-03-24 DIAGNOSIS — I10 Essential (primary) hypertension: Secondary | ICD-10-CM

## 2021-03-24 DIAGNOSIS — J4540 Moderate persistent asthma, uncomplicated: Secondary | ICD-10-CM

## 2021-03-24 DIAGNOSIS — K219 Gastro-esophageal reflux disease without esophagitis: Secondary | ICD-10-CM

## 2021-03-24 NOTE — Progress Notes
Subjective:       History of Present Illness  Morgan Sharp is a 70 y.o. female who is here for her final patch test reading.     She has one spot that has reacted.          Review of Systems   Skin: Positive for rash.         Objective:         ? atorvastatin (LIPITOR) 10 mg tablet Take 10 mg by mouth daily.   ? Cetirizine (ZYRTEC) 10 mg Cap Take 1 Cap by mouth twice daily.   ? clobetasoL (TEMOVATE) 0.05 % topical ointment Apply  topically to affected area twice daily. Do not use on your face, groin, or armpit.   ? esomeprazole magnesium (NEXIUM PO) Take  by mouth.   ? famotidine(+) (PEPCID) 40 mg tablet Take one tablet by mouth twice daily. (Patient taking differently: Take 40 mg by mouth three times daily.)   ? FLOVENT HFA 110 mcg/actuation inhaler INHALE 2 PUFFS BY MOUTH ONCE DAILY   ? fluticasone propionate (CUTIVATE) 0.005 % ointment Apply  topically to affected area twice daily.   ? fluticasone propionate (FLONASE) 50 mcg/actuation nasal spray, suspension Apply 2 sprays to each nostril as directed daily. Shake bottle gently before using.   ? lisinopril (PRINIVIL; ZESTRIL) 20 mg tablet Take 20 mg by mouth daily.     ? montelukast (SINGULAIR) 10 mg tablet TAKE 1 TABLET BY MOUTH EVERY DAY   ? TRIAMCINOLONE ACETONIDE (NASACORT NA) Insert  into nose as directed as Needed.   ? VENTOLIN HFA 90 mcg/actuation inhaler Inhale two puffs by mouth into the lungs every 4 hours as needed.     There were no vitals filed for this visit.  There is no height or weight on file to calculate BMI.     Physical Exam  Single area of erythema and vesiculation a the site of the chlorhexidine.             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   Limited treatment options due to steroid allergies.   Responded to fluticasone topically since she is unable to tolerate other steroids.     03/24/21 - patch testing negative to local anesthetic panel, but positive to chlorhexidine  - Will need to avoid chlorhexidine, patient provided information on this and will need to make all her physicians aware of this, particularly for upcoming surgical interventions.      Drug Allergy  Aspirin Allergy    Oral steroids cause increase in urticaria 1-2 days after starting the medication: medrol, prednisone, fluticasone (Advair), and budesonide (Symbicort).  02/16/11 - we performed testing to steroids, SPT was positive to undiluted Dexamethasone and Methylprednisolone. ID testing positive to Orapred in 1:10 dilution. It proves she has a true IgE mediated reaction.  - She should continue avoidance, tolerates fluticasone topical.    Allopurinol taken by accident caused anaphylaxis with urticaria.     Excedrin did  not cause urticaria, but was told to stop by her allergist at the time. She has never taken any ASA or NSAIDs since her 77s, cannot recall if it affected her asthma.    Tylenol in 3 gm doses or higher causes urticaria.    PCN in her 34s caused her to black out, and on a second event had hives 24-48 hours after her first dose of penicillin.   03/12/17 - penicillin testing had negative SPT results but positive intradermal testing to 2 out of 3 amoxicillin intradermals placed and she broke out in hives. We did not proceed with oral challenge. If the patient ever requires penicillin in the future, we will perform drug desensitization.     Chlorhexidine - diffuse rash after small exposure on the neck, patch testing positive 03/24/21.    - Continue avoidance of above.     We discussed that she should adjust her Emergency ID to include: Allopurinol, NSAIDs, ASA, Penicillin, All steroids except fluticasone, Chlorhexidine       RTC as scheduled.     Thank you for allowing [place] to participate in the care of this patient.  Please feel free to contact us if there are any questions or concerns about the patient.     Hyman Bower, DO  Associate Professor  Division of Allergy, Immunology, and Rheumatology  Department of Medicine and Department of Pediatrics  [facility]        Total time 15 minutes.  Additional time required for:  Performing a medically appropriate examination and or evaluation  Counseling and educating the patient/family/caregiver

## 2021-03-24 NOTE — Patient Instructions
Allopurinol, NSAIDs, ASA, Penicillin, All steroids except fluticasone, Chlorhexidine

## 2021-03-26 ENCOUNTER — Encounter: Admit: 2021-03-26 | Discharge: 2021-03-26 | Payer: MEDICARE

## 2021-03-26 DIAGNOSIS — J4540 Moderate persistent asthma, uncomplicated: Secondary | ICD-10-CM

## 2021-03-26 DIAGNOSIS — K219 Gastro-esophageal reflux disease without esophagitis: Secondary | ICD-10-CM

## 2021-03-26 DIAGNOSIS — Z889 Allergy status to unspecified drugs, medicaments and biological substances status: Secondary | ICD-10-CM

## 2021-03-26 DIAGNOSIS — I10 Essential (primary) hypertension: Secondary | ICD-10-CM

## 2021-03-26 DIAGNOSIS — J329 Chronic sinusitis, unspecified: Secondary | ICD-10-CM

## 2021-09-05 ENCOUNTER — Encounter: Admit: 2021-09-05 | Discharge: 2021-09-05 | Payer: MEDICARE

## 2021-09-05 MED ORDER — FLOVENT HFA 110 MCG/ACTUATION IN HFAA
0 refills
Start: 2021-09-05 — End: ?

## 2021-09-06 ENCOUNTER — Encounter: Admit: 2021-09-06 | Discharge: 2021-09-06 | Payer: MEDICARE

## 2022-01-09 ENCOUNTER — Encounter: Admit: 2022-01-09 | Discharge: 2022-01-09 | Payer: MEDICARE

## 2022-01-09 ENCOUNTER — Ambulatory Visit: Admit: 2022-01-09 | Discharge: 2022-01-10 | Payer: MEDICARE

## 2022-01-09 DIAGNOSIS — J45909 Unspecified asthma, uncomplicated: Secondary | ICD-10-CM

## 2022-01-09 MED ORDER — VENTOLIN HFA 90 MCG/ACTUATION IN HFAA
2 | RESPIRATORY_TRACT | 1 refills | Status: AC | PRN
Start: 2022-01-09 — End: ?

## 2022-01-09 MED ORDER — TRELEGY ELLIPTA 200-62.5-25 MCG IN DSDV
1 | Freq: Every day | RESPIRATORY_TRACT | 1 refills | Status: AC
Start: 2022-01-09 — End: ?

## 2022-01-21 ENCOUNTER — Encounter: Admit: 2022-01-21 | Discharge: 2022-01-21 | Payer: MEDICARE

## 2022-01-21 DIAGNOSIS — J329 Chronic sinusitis, unspecified: Secondary | ICD-10-CM

## 2022-01-21 DIAGNOSIS — K219 Gastro-esophageal reflux disease without esophagitis: Secondary | ICD-10-CM

## 2022-01-21 DIAGNOSIS — I10 Essential (primary) hypertension: Secondary | ICD-10-CM

## 2022-01-21 DIAGNOSIS — Z889 Allergy status to unspecified drugs, medicaments and biological substances status: Secondary | ICD-10-CM

## 2022-01-21 DIAGNOSIS — J4540 Moderate persistent asthma, uncomplicated: Secondary | ICD-10-CM

## 2022-08-02 ENCOUNTER — Encounter: Admit: 2022-08-02 | Discharge: 2022-08-02 | Payer: MEDICARE

## 2022-08-02 MED ORDER — ARNUITY ELLIPTA 100 MCG/ACTUATION IN DSDV
1 | Freq: Every day | RESPIRATORY_TRACT | 1 refills
Start: 2022-08-02 — End: ?

## 2022-08-20 ENCOUNTER — Encounter: Admit: 2022-08-20 | Discharge: 2022-08-20 | Payer: MEDICARE

## 2022-08-20 ENCOUNTER — Ambulatory Visit: Admit: 2022-08-20 | Discharge: 2022-08-21 | Payer: MEDICARE

## 2022-08-20 DIAGNOSIS — J4541 Moderate persistent asthma with (acute) exacerbation: Secondary | ICD-10-CM

## 2022-08-20 DIAGNOSIS — J4540 Moderate persistent asthma, uncomplicated: Secondary | ICD-10-CM

## 2022-08-20 DIAGNOSIS — J45909 Unspecified asthma, uncomplicated: Secondary | ICD-10-CM

## 2022-08-20 DIAGNOSIS — Z888 Allergy status to other drugs, medicaments and biological substances status: Secondary | ICD-10-CM

## 2022-08-20 DIAGNOSIS — J329 Chronic sinusitis, unspecified: Secondary | ICD-10-CM

## 2022-08-20 DIAGNOSIS — Z889 Allergy status to unspecified drugs, medicaments and biological substances status: Secondary | ICD-10-CM

## 2022-08-20 DIAGNOSIS — K219 Gastro-esophageal reflux disease without esophagitis: Secondary | ICD-10-CM

## 2022-08-20 DIAGNOSIS — R438 Other disturbances of smell and taste: Secondary | ICD-10-CM

## 2022-08-20 DIAGNOSIS — L508 Other urticaria: Secondary | ICD-10-CM

## 2022-08-20 DIAGNOSIS — J309 Allergic rhinitis, unspecified: Secondary | ICD-10-CM

## 2022-08-20 DIAGNOSIS — I10 Essential (primary) hypertension: Secondary | ICD-10-CM

## 2022-08-20 MED ORDER — OLOPATADINE 0.6 % NA SPRY
2 | Freq: Two times a day (BID) | NASAL | 1 refills | 60.00000 days | Status: AC
Start: 2022-08-20 — End: ?

## 2022-08-20 NOTE — Patient Instructions
Continue all current medications.  Add on olopatadine 1-2 sprays each nostril twice daily as needed. Use this with your Flonase.   Consider Dupixent or Xolair in the future.

## 2022-08-24 ENCOUNTER — Encounter: Admit: 2022-08-24 | Discharge: 2022-08-24 | Payer: MEDICARE

## 2022-08-24 DIAGNOSIS — J4540 Moderate persistent asthma, uncomplicated: Secondary | ICD-10-CM

## 2022-08-24 DIAGNOSIS — Z889 Allergy status to unspecified drugs, medicaments and biological substances status: Secondary | ICD-10-CM

## 2022-08-24 DIAGNOSIS — J329 Chronic sinusitis, unspecified: Secondary | ICD-10-CM

## 2022-08-24 DIAGNOSIS — I10 Essential (primary) hypertension: Secondary | ICD-10-CM

## 2022-08-24 DIAGNOSIS — K219 Gastro-esophageal reflux disease without esophagitis: Secondary | ICD-10-CM

## 2022-10-01 ENCOUNTER — Encounter: Admit: 2022-10-01 | Discharge: 2022-10-01 | Payer: MEDICARE

## 2022-10-01 MED ORDER — FLOVENT HFA 110 MCG/ACTUATION IN HFAA
6 refills | Status: AC
Start: 2022-10-01 — End: ?

## 2022-10-01 MED ORDER — FLOVENT HFA 110 MCG/ACTUATION IN HFAA
6 refills
Start: 2022-10-01 — End: ?

## 2022-10-01 NOTE — Telephone Encounter
Pharmacy requesting a refill of FLOVENT HFA 110 mcg/actuation inhaler . Patient last seen 08/20/2022. Follow up scheduled 03/04/2023. Per last OV note, She is currently well controlled on Flovent 110 mcg 2 puffs once daily with albuterol as needed. Arnuity is still costly but more affordable than Flovent.      - Will transition to Arnuity due to cost when she runs out of her Flovent. .      Routing to Dr.Gierer for approval if warranted

## 2022-11-29 ENCOUNTER — Encounter: Admit: 2022-11-29 | Discharge: 2022-11-29 | Payer: MEDICARE

## 2023-03-04 ENCOUNTER — Encounter: Admit: 2023-03-04 | Discharge: 2023-03-04 | Payer: MEDICARE

## 2023-03-04 ENCOUNTER — Ambulatory Visit: Admit: 2023-03-04 | Discharge: 2023-03-04 | Payer: MEDICARE

## 2023-03-04 DIAGNOSIS — L508 Other urticaria: Secondary | ICD-10-CM

## 2023-03-04 DIAGNOSIS — J309 Allergic rhinitis, unspecified: Secondary | ICD-10-CM

## 2023-03-04 DIAGNOSIS — J329 Chronic sinusitis, unspecified: Secondary | ICD-10-CM

## 2023-03-04 DIAGNOSIS — J4541 Moderate persistent asthma with (acute) exacerbation: Secondary | ICD-10-CM

## 2023-03-04 DIAGNOSIS — J339 Nasal polyp, unspecified: Secondary | ICD-10-CM

## 2023-03-04 DIAGNOSIS — J45909 Unspecified asthma, uncomplicated: Secondary | ICD-10-CM

## 2023-03-04 MED ORDER — DUPIXENT PEN 300 MG/2 ML SC PNIJ
300 mg | SUBCUTANEOUS | 6 refills | 28.00000 days | Status: AC
Start: 2023-03-04 — End: ?
  Filled 2023-04-02: qty 4, 28d supply, fill #1

## 2023-03-04 MED ORDER — DOXYCYCLINE HYCLATE 100 MG PO CAP
100 mg | ORAL_CAPSULE | Freq: Two times a day (BID) | ORAL | 0 refills | 8.00000 days | Status: AC
Start: 2023-03-04 — End: ?

## 2023-03-04 MED ORDER — DUPIXENT PEN 300 MG/2 ML SC PNIJ
300 mg | SUBCUTANEOUS | 3 refills | Status: CN
Start: 2023-03-04 — End: ?

## 2023-03-04 NOTE — Progress Notes
Specialty Medication Education and Injection Education    Morgan Sharp was contacted to provide medication education on their new specialty medication.    Morgan Sharp was provided with education on their specialty medication(s) [name]. Discussion with the patient included: the medication name (brand and generic), medication class, dosing, frequency, duration, route, proper administration, monitoring, common side effects, contraindications, safety precautions, and food/drug interactions to be aware of. The indication, expectations and possible outcomes from treatment were also discussed.     Appropriate storage, safe handling, and disposal directions were reviewed. The patient was educated on timely administration of therapy and management of missed doses. Adherence with therapy and the patient's ability to be adherent with drug therapies were discussed and the patient was provided options for tools/resources that promote adherence to therapy as needed. The patient's ability to self-administer the medication was assessed. Requirements of the REMS program were discussed with the patient as applicable. Recommended vaccinations were reviewed and discussed with the patient as applicable. The patient was instructed to seek medical attention immediately if they experience signs of an allergic reaction, including but not limited to: a rash; hives; itching; red, swollen, blistered, or peeling skin with or without fever.     Medication history and reconciliation were performed (including prescription medications, supplements, over the counter, and herbal products). The medication list was updated and the patient's current medication list is listed below.     Home Medications    Medication Sig   atorvastatin (LIPITOR) 10 mg tablet Take one tablet by mouth daily.   Cetirizine (ZYRTEC) 10 mg Cap Take 1 Cap by mouth twice daily.   clobetasoL (TEMOVATE) 0.05 % topical ointment Apply topically to affected area twice daily. Do not use on your face, groin, or armpit.   doxycycline hyclate (VIBRAMYCIN) 100 mg capsule Take one capsule by mouth twice daily for 7 days.   dupilumab (DUPIXENT PEN) 300 mg/2 mL injectable PEN Inject 2 mL under the skin every 14 days. Indications: chronic rhinosinusitis with nasal polyposis   esomeprazole magnesium (NEXIUM PO) Take  by mouth.   famotidine(+) (PEPCID) 40 mg tablet Take one tablet by mouth twice daily.  Patient taking differently: Take one tablet by mouth three times daily.   FLOVENT HFA 110 mcg/actuation inhaler INHALE 2 PUFFS BY MOUTH ONCE DAILY   fluticasone furoate (ARNUITY ELLIPTA) 100 mcg/actuation disk inhaler Inhale one puff by mouth into the lungs daily.   fluticasone propionate (CUTIVATE) 0.005 % ointment Apply  topically to affected area twice daily.   fluticasone propionate (FLONASE) 50 mcg/actuation nasal spray, suspension Apply two sprays to each nostril as directed daily. Shake bottle gently before using.   fluticasone-umeclidin-vilanter (TRELEGY ELLIPTA) 200-62.5-25 mcg inhaler Inhale one puff by mouth into the lungs daily.   lisinopril (PRINIVIL; ZESTRIL) 20 mg tablet Take one tablet by mouth daily.     montelukast (SINGULAIR) 10 mg tablet TAKE 1 TABLET BY MOUTH EVERY DAY   olopatadine (PATANASE) 0.6 % nasal spray Apply two sprays to each nostril as directed twice daily.   VENTOLIN HFA 90 mcg/actuation inhaler Inhale two puffs by mouth into the lungs every 4 hours as needed.      Drug-drug and drug-food interactions with the new therapy were assessed and reviewed with the patient. The patient was instructed to speak with their health care provider before starting any new drug, including prescription or over the counter, natural / herbal products, or vitamins.    Pregnancy Status: Female,  not of child-bearing potential, education not applicable.    Appropriate recommended vaccinations were reviewed and discussed with the patient.    Medication Education  The patient was counseled in person. 15 minutes were spent educating the patient.    Patient was educated on proper administration technique as well as storage/disposal. The patient demonstrated correct technique after instruction.    Provided education on dupilumab (Dupixent), including:  -purpose, expected benefit  -dosing   -SC: 300mg  every 2 weeks   -comes as PFS or autoinjector pen  -side effects   -most common: injection site reactions   -rare: joint pain, conjunctivitis (eye redness, itching, dry eye), increased incidence of cold sores, eosinophilia  -other info   -avoid live vaccinations  -monitoring    -none    Morgan Sharp was given the opportunity to ask questions but did not have any questions at the time. Patient was reminded of the refill process and encouraged to call with questions. The monitoring and follow-up plan was discussed with the patient. The patient was instructed to contact their health care provider if their symptoms or health problems do not get better or if they become worse. For clinical questions about this medication, the pharmacist can be reached at [phone]. For questions about cost, insurance coverage, or to obtain refills, the patient should contact the pharmacy via MyChart or by calling [phone]. The patient verbalized acceptance and understanding.    Follow-up Plan    The filling pharmacy is unknown at this time and will be determined at a later date.    Morgan Sharp, [facility]

## 2023-03-05 ENCOUNTER — Encounter: Admit: 2023-03-05 | Discharge: 2023-03-05 | Payer: MEDICARE

## 2023-03-05 DIAGNOSIS — Z888 Allergy status to other drugs, medicaments and biological substances status: Secondary | ICD-10-CM

## 2023-03-05 NOTE — Progress Notes
Pharmacy Benefits Investigation    Medication name: dupilumab (DUPIXENT PEN) 300 mg/2 mL injectable PEN  Medication status: new    The insurance requires a prior authorization for the medication. The prior authorization was submitted via CoverMyMeds. Will follow up in 2 business days.    PA number: [id number]    Morgan Sharp  Specialty Pharmacy Patient Advocate

## 2023-03-07 ENCOUNTER — Encounter: Admit: 2023-03-07 | Discharge: 2023-03-07 | Payer: MEDICARE

## 2023-03-07 NOTE — Progress Notes
Pharmacy Benefits Investigation    Medication name: dupilumab (DUPIXENT PEN) 300 mg/2 mL injectable PEN  Medication status: new    The prior authorization was approved for Morgan Sharp (PA number [id number]) from 02/19/2023 through 09/01/2023.    The out of pocket cost today is $807.92 for 28 days. This cost may change due to factors including but not limited to changes in insurance coverage.    Assistance is available through [facility] [place] [facility] [employer] [facility] Copay Assistance Program. Contacted the patient to notify them the following information is needed to apply for assistance: patient signature and patient financial information.    The specialty pharmacy is pursuing medication assistance through the manufacturer [employer] Dupixent [employer]    Contacted the patient to obtain patient signature and patient financial information to submit the application. Spoke with patient. A packet will be sent to the patient to complete. Patient would like the paperwork to be sent through mail. They understand information is needed and will return it to the specialty pharmacy. Will follow up in 5 business days if it has not been received. Please contact the specialty pharmacy at [phone] with any questions regarding next steps.    Jashayla Glatfelter [facility]  Specialty Pharmacy Patient Advocate

## 2023-03-13 ENCOUNTER — Encounter: Admit: 2023-03-13 | Discharge: 2023-03-13 | Payer: MEDICARE

## 2023-03-19 ENCOUNTER — Encounter: Admit: 2023-03-19 | Discharge: 2023-03-19 | Payer: MEDICARE

## 2023-03-19 NOTE — Progress Notes
Pharmacy Benefits Investigation    Medication name: dupilumab (DUPIXENT PEN) 300 mg/2 mL injectable PEN  Medication status: new    Contacted Marko Stai to verify patient received application for assistance regarding their medication: dupixent.  Left voicemail asking patient to return call to the specialty pharmacy [phone]).  Will call patient again in 3 business days if no call back received.Mcneil Sober  Pharmacy Patient Advocate, Specialty Pharmacy  [phone]     Derrian Rodak [facility]  Specialty Pharmacy Patient Advocate

## 2023-03-25 ENCOUNTER — Encounter: Admit: 2023-03-25 | Discharge: 2023-03-25 | Payer: MEDICARE

## 2023-03-26 ENCOUNTER — Encounter: Admit: 2023-03-26 | Discharge: 2023-03-26 | Payer: MEDICARE

## 2023-03-27 ENCOUNTER — Encounter: Admit: 2023-03-27 | Discharge: 2023-03-27 | Payer: MEDICARE

## 2023-03-27 IMAGING — US [ID]
1 series · 14 of 16 positions shown · non-contrast
Comparison: none

[Series 1: us guided fna, 1st lesion · 14 of 22 slices shown]
[im 1/22]
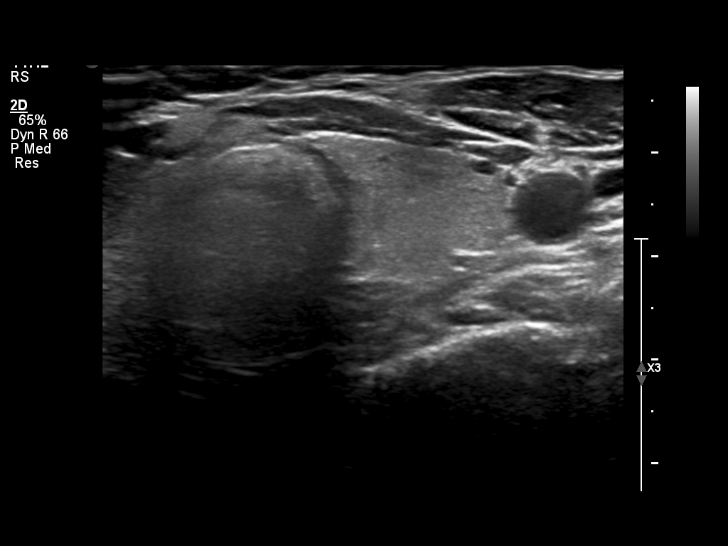
[im 2/22]
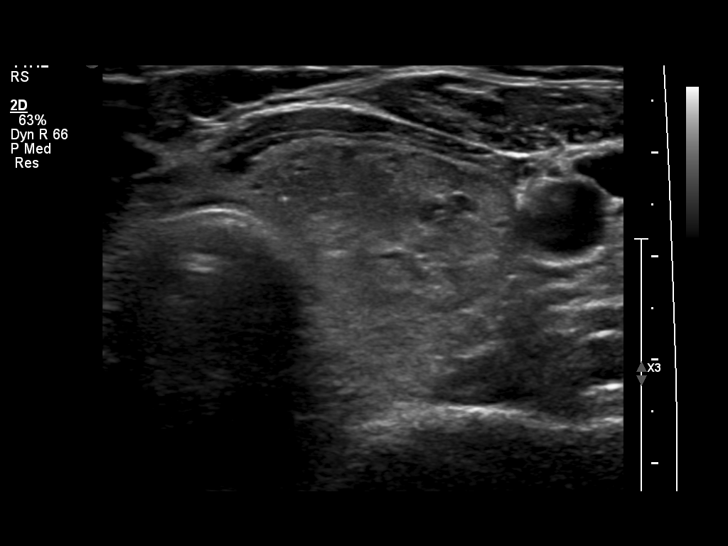
[im 3/22]
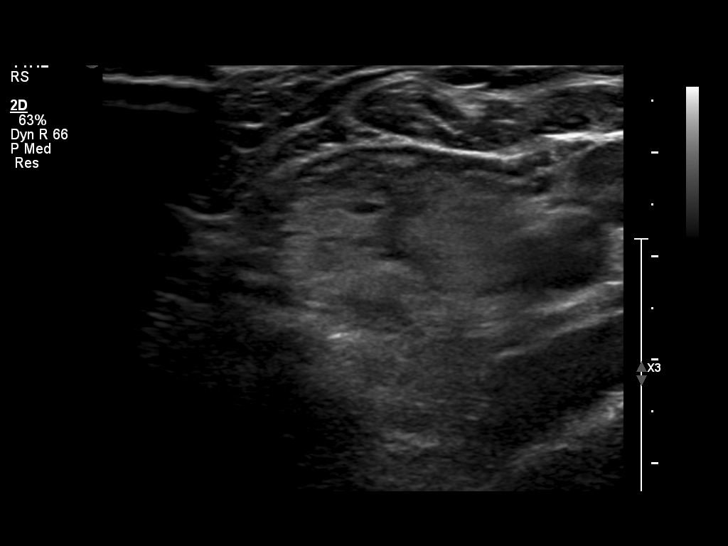
[im 6/22]
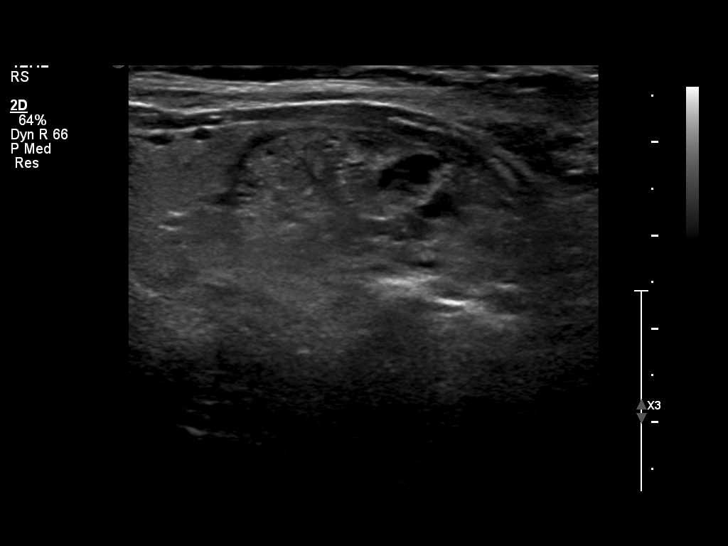
[im 8/22]
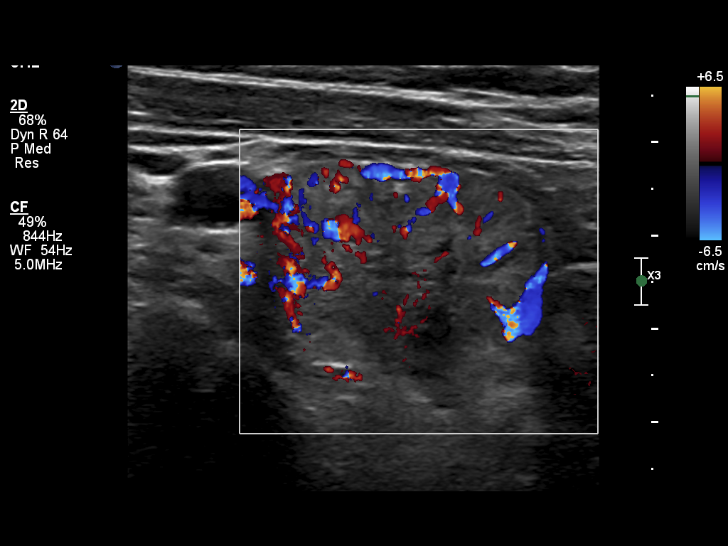
[im 9/22]
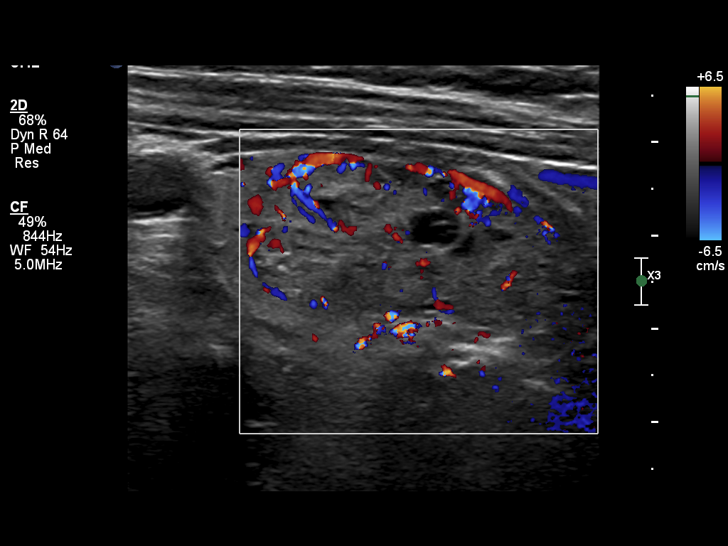
[im 10/22]
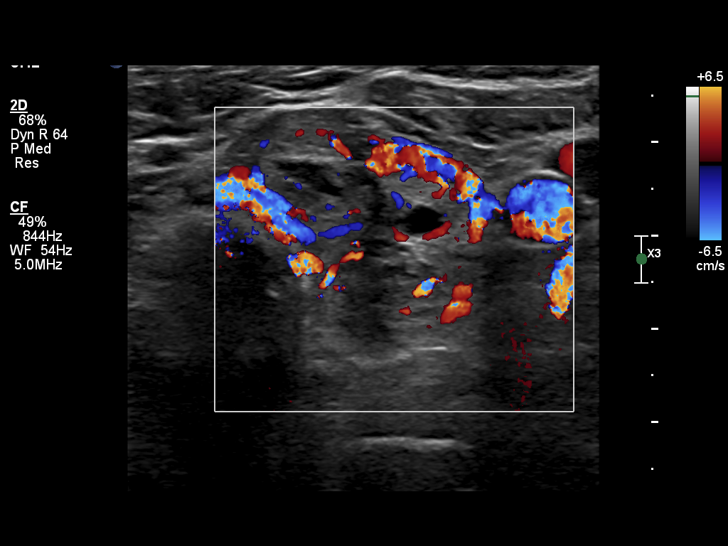
[im 12/22]
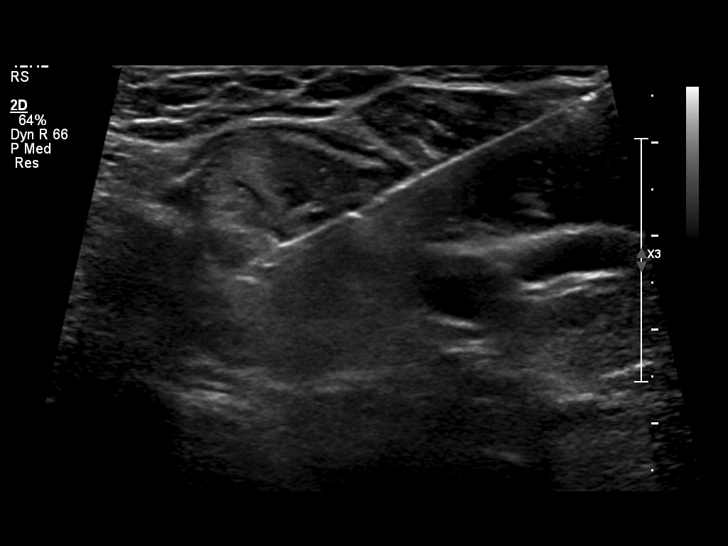
[im 13/22]
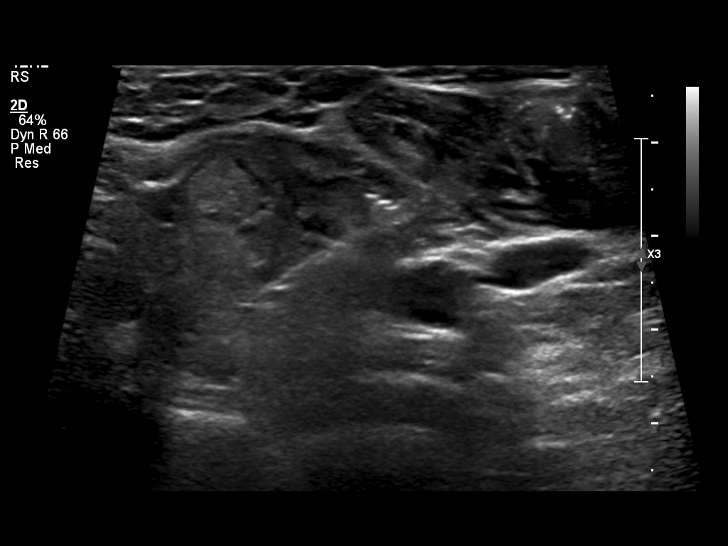
[im 15/22]
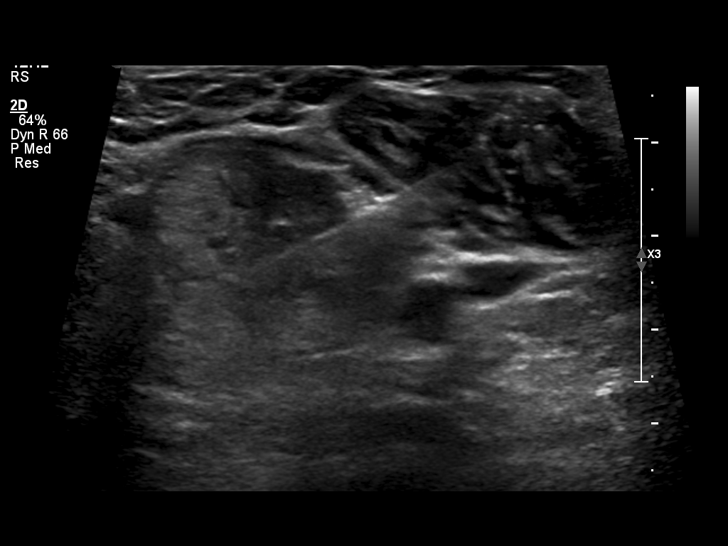
[im 17/22]
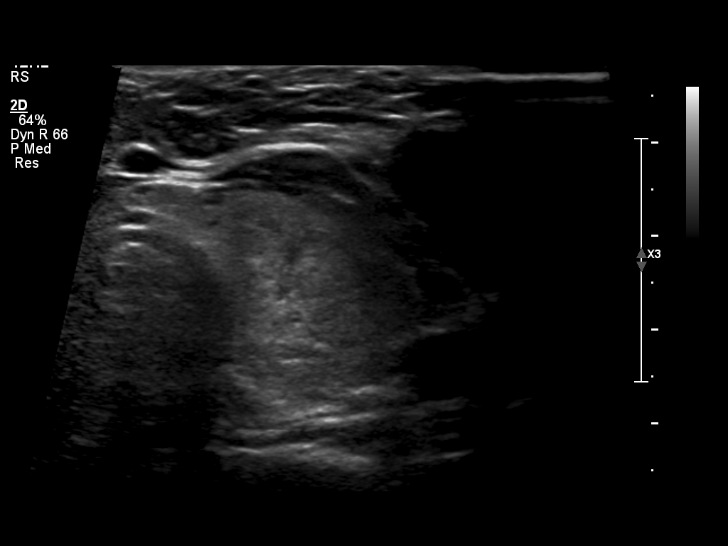
[im 19/22]
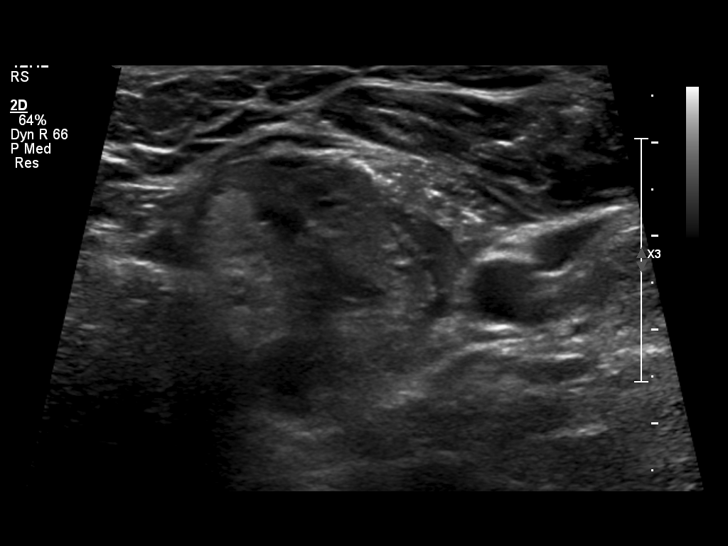
[im 20/22]
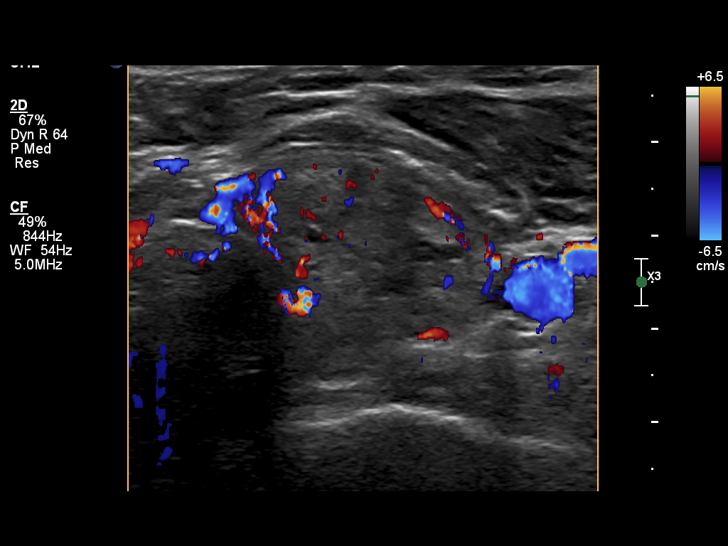
[im 22/22]
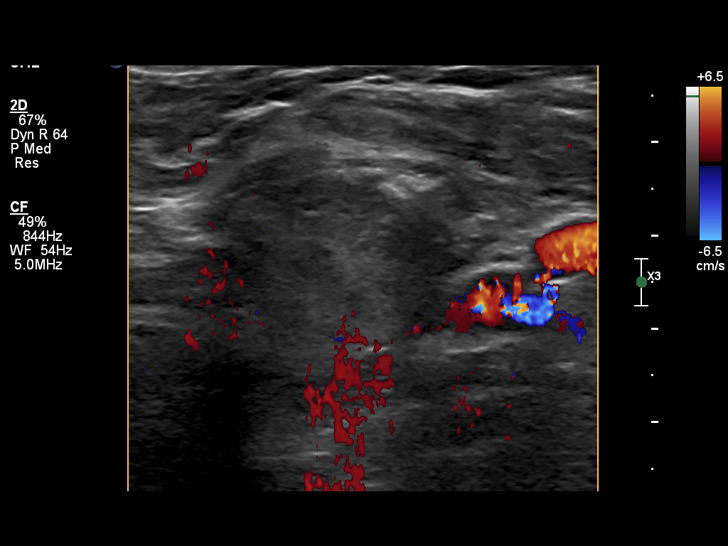

[14 of 16 positions shown; findings below may reference images not displayed]

Aujla, Caleb  * Location:

EXAM

Ultrasound biopsy thyroid core needle

INDICATION

thyroid nodule
abnormal ultrasound; lt thyroid nodule

TECHNIQUE

Ultrasound-guided thyroid biopsy

COMPARISONS

January 09, 2021

FINDINGS

The patient was brought to the sonography suite. Risk and benefits were explained to the patient
including the possibility of a nondiagnostic sample and informed consent was obtained.

Region of interest in the left thyroid was localized with ultrasound. The patient was prepped and
draped in the usual sterile fashion. 1 percent lidocaine was used for local anesthesia. Utilizing
ultrasound guidance a guide needle was placed and a total of 5 samples measuring 10 mm were taken
with the 18 gauge biopsy device. The needle was retracted and pressure was held until hemostasis was
maintained.

No immediate complications.

IMPRESSION

Uncomplicated ultrasound-guided left thyroid biopsy.

Tech Notes:

abnormal ultrasound; lt thyroid nodule

## 2023-03-27 NOTE — Progress Notes
Pharmacy Benefits Investigation    Medication name: dupilumab (DUPIXENT PEN) 300 mg/2 mL injectable PEN  Medication status: new    The out of pocket cost today is $807.92 for 28 days. This cost may change due to factors including but not limited to changes in insurance coverage.    Assistance is available through [facility] Copay Assistance Program. Funding was obtained and will provide the patient with assistance through 03/26/2024.    Kennyth Arnold stated the copay is affordable. The patient requires education from the ambulatory pharmacist prior to the medication being dispensed. The pharmacist was notified. Will await notification from the pharmacist that it is okay to dispense the medication per the patient's preference.    Morgan Sharp  Specialty Pharmacy Patient Advocate

## 2023-03-28 ENCOUNTER — Encounter: Admit: 2023-03-28 | Discharge: 2023-03-28 | Payer: MEDICARE

## 2023-03-28 NOTE — Progress Notes
Pharmacy Medication Initial Assessment    Indication/Regimen  The regimen of [name] indefinitely is appropriate for Morgan Sharp who has Aspirin-sensitive asthma with nasal polyps.    Renal dose adjustments are not required. Hepatic dose adjustments are not required. Dose titration is not required.    The patient has the ability to self-administer the medication(s).    Baseline Characteristics  Current medications for this indication: Dupixent    Therapeutic Goals and Monitoring  The goal of therapy is to reduce or eliminate symptoms with minimal side effects from treatment.Baseline Characteristics  Current asthma medications: Dupixent, Arnuity, albuterol prn  Inadequate control with inhaled glucocorticoids: yes    Therapeutic Goals and Monitoring  The goal of therapy is to decrease the rate of asthma exacerbations.    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    Hyposmia     Additional comorbidities: no    Labs and Diagnostic Tests  No results found for: WBC, RBC, HGB, HCT, MCV, MCH, MCHC, RDW, PLTCT, MPV  No results found for: NEUT, ANC, LYMA, ALC, MONA, AMC, EOSA, AEC, BASA, ABC    Allergies  Allergies   Allergen Reactions    Allopurinol ANAPHYLAXIS    Chlorhexidine RASH     Severe contact dermatitis, verified by patch testing 03/24/21    Advair Diskus [Fluticasone Propion-Salmeterol] HIVES    Aspirin HIVES and SEE COMMENTS     Respiratory Distress    Nsaids (Non-Steroidal Anti-Inflammatory Drug) HIVES    Pcn [Penicillins] SYNCOPE     Black-out in her 35s.    Prednisone HIVES Proven by skin testing.    Symbicort [Budesonide-Formoterol] HIVES        Immunizations  Vaccine history was reviewed with the patient. Education was provided on the importance of completing vaccines. The patient should avoid live vaccines.    Immunization History   Administered Date(s) Administered    COVID-19 (MODERNA), mRNA vacc, 100 mcg/0.5 mL (PF) 05/14/2019, 06/11/2019, 03/03/2020    Covid-19 Bivalent (71yr+)(MODERNA), mRNA Vacc, 50 mcg/0.5 mL (PF) 01/03/2021    Covid-19 mRNA Vaccine >=12yo (Moderna)(Spikevax) 01/29/2022    Flu Vaccine 6-35 MO (Historical) 02/14/2013    Flu Vaccine =>6 Months Trivalent PF 02/08/2012    Flu Vaccine =>65 YO High-Dose (PF) 02/17/2018    Pneumococcal Vaccine(13-Val Peds/immunocompromised adult) 10/26/2015       Medication Reconciliation  Medication history and reconciliation were performed (including prescription medications, supplements, over the counter, and herbal products). The medication list was updated and the patient's current medication list is included. The patient was instructed to speak with their health care provider before starting any new drug, including prescription or over the counter, natural / herbal products, or vitamins.    Drug Interactions    Drug-Drug Interactions  Drug-drug interactions were evaluated. There were not clinically significant drug-drug interactions.     Drug-Food Interactions  Drug-food interactions were not evaluated (NA - not oral).    Home Medications    Medication Sig   atorvastatin (LIPITOR) 10 mg tablet Take one tablet by mouth daily.  Cetirizine (ZYRTEC) 10 mg Cap Take 1 Cap by mouth twice daily.   clobetasoL (TEMOVATE) 0.05 % topical ointment Apply  topically to affected area twice daily. Do not use on your face, groin, or armpit.   dupilumab (DUPIXENT PEN) 300 mg/2 mL injectable PEN Inject 2 mL under the skin every 14 days. Indications: chronic rhinosinusitis with nasal polyposis   esomeprazole magnesium (NEXIUM PO) Take  by mouth. famotidine(+) (PEPCID) 40 mg tablet Take one tablet by mouth twice daily.  Patient taking differently: Take one tablet by mouth three times daily.   FLOVENT HFA 110 mcg/actuation inhaler INHALE 2 PUFFS BY MOUTH ONCE DAILY   fluticasone furoate (ARNUITY ELLIPTA) 100 mcg/actuation disk inhaler Inhale one puff by mouth into the lungs daily.   fluticasone propionate (CUTIVATE) 0.005 % ointment Apply  topically to affected area twice daily.   fluticasone propionate (FLONASE) 50 mcg/actuation nasal spray, suspension Apply two sprays to each nostril as directed daily. Shake bottle gently before using.   fluticasone-umeclidin-vilanter (TRELEGY ELLIPTA) 200-62.5-25 mcg inhaler Inhale one puff by mouth into the lungs daily.   lisinopril (PRINIVIL; ZESTRIL) 20 mg tablet Take one tablet by mouth daily.     montelukast (SINGULAIR) 10 mg tablet TAKE 1 TABLET BY MOUTH EVERY DAY   olopatadine (PATANASE) 0.6 % nasal spray Apply two sprays to each nostril as directed twice daily.   VENTOLIN HFA 90 mcg/actuation inhaler Inhale two puffs by mouth into the lungs every 4 hours as needed.     Adverse Drug Reactions  Patient will be educated on common side effects.    Adherence  Patient will be educated on the importance of adherence.    Safety Precautions    Risk Evaluation and Mitigation (REMS) Assessment: REMS is not required for this medication.    Safety precautions were addressed and discussed with the patient as applicable.    Contraindications: Morgan Sharp does not have contraindications to this medication.    Pregnancy Status: Female, not of child-bearing potential, education not applicable.    Medication Education  Counseling was not completed because patient was previously educated and did not require additional counseling. Education provided in clinic on 03/04/23.    Morgan Sharp was given the opportunity to ask questions but did not have any questions at the time. Patient was reminded of the refill process and encouraged to call with questions. The monitoring and follow-up plan was discussed with the patient. The patient was instructed to contact their health care provider if their symptoms or health problems do not get better or if they become worse. For clinical questions about this medication, the pharmacist can be reached at [phone]. For questions about cost, insurance coverage, or to obtain refills, the patient should contact the pharmacy via MyChart or by calling [phone]. The patient verbalized acceptance and understanding.    Follow-up Plan  The patient will be reassessed within 1 month of starting the medication.    The medication(s) will be shipped from [facility].    Lamona Curl, [facility]

## 2023-03-28 NOTE — Progress Notes
Placed call to patient to schedule Dupixent injection teach for 1st dose. Scheduled for 12/19 at 10am. Verified address for shipment of medication. Patient aware to bring 1 pen with her and to take out of the fridge 45 min beforehand.

## 2023-03-28 NOTE — Progress Notes
Pharmacy Benefits Investigation    Medication name: dupilumab (DUPIXENT PEN) 300 mg/2 mL injectable PEN  Medication status: new    Contacted Marko Stai to discuss first fill regarding their medication: dupixent. Need to verify shipping address. Left voicemail asking patient to return call to the specialty pharmacy [phone]).  Will call patient again in 2 business days if no call back received.Mcneil Sober  Pharmacy Patient Advocate, Specialty Pharmacy  [phone]

## 2023-04-01 ENCOUNTER — Encounter: Admit: 2023-04-01 | Discharge: 2023-04-01 | Payer: MEDICARE

## 2023-04-02 ENCOUNTER — Encounter: Admit: 2023-04-02 | Discharge: 2023-04-02 | Payer: MEDICARE

## 2023-04-04 ENCOUNTER — Encounter: Admit: 2023-04-04 | Discharge: 2023-04-04 | Payer: MEDICARE

## 2023-04-04 ENCOUNTER — Ambulatory Visit: Admit: 2023-04-04 | Discharge: 2023-04-05 | Payer: MEDICARE

## 2023-04-04 NOTE — Progress Notes
Specialty Medication Injection Education    Medication name: [name]    Patient was educated on proper administration technique as well as storage/disposal. The patient demonstrated correct technique after instruction.    The patient successfully injected the medication into the abdomen Next dose due on 04/18/2023.    The monitoring and follow-up plan was discussed with the patient. The patient was instructed to contact their health care provider if their symptoms or health problems do not get better or if they become worse. Morgan Sharp was instructed to contact the specialty pharmacy at [phone] if they have any questions or concerns regarding their medication therapy.    Lamona Curl, [facility]

## 2023-04-18 ENCOUNTER — Encounter: Admit: 2023-04-18 | Discharge: 2023-04-18 | Payer: MEDICARE

## 2023-04-20 ENCOUNTER — Encounter: Admit: 2023-04-20 | Discharge: 2023-04-20 | Payer: MEDICARE

## 2023-04-24 ENCOUNTER — Encounter: Admit: 2023-04-24 | Discharge: 2023-04-24 | Payer: MEDICARE

## 2023-04-25 ENCOUNTER — Encounter: Admit: 2023-04-25 | Discharge: 2023-04-25 | Payer: MEDICARE

## 2023-04-25 MED FILL — DUPIXENT PEN 300 MG/2 ML SC PNIJ: 3002 mg/2 mL | SUBCUTANEOUS | 28 days supply | Qty: 4 | Fill #2 | Status: AC

## 2023-04-29 ENCOUNTER — Encounter: Admit: 2023-04-29 | Discharge: 2023-04-29 | Payer: MEDICARE

## 2023-05-01 ENCOUNTER — Encounter: Admit: 2023-05-01 | Discharge: 2023-05-01 | Payer: MEDICARE

## 2023-05-01 NOTE — Progress Notes
Specialty Medication Reassessment Attempt    Medication name: [name]    Contacted Kennyth Arnold to verify compliance and assess tolerance of their specialty medication.    Left voicemail asking patient to return call to the pharmacist at [phone]. This is the second attempt to contact the patient. Will follow up in 3 business day(s) if patient has not returned call.    Lamona Curl, [facility]

## 2023-05-10 ENCOUNTER — Encounter: Admit: 2023-05-10 | Discharge: 2023-05-10 | Payer: MEDICARE

## 2023-05-13 ENCOUNTER — Encounter: Admit: 2023-05-13 | Discharge: 2023-05-13 | Payer: MEDICARE

## 2023-05-15 ENCOUNTER — Encounter: Admit: 2023-05-15 | Discharge: 2023-05-15 | Payer: MEDICARE

## 2023-05-17 ENCOUNTER — Encounter: Admit: 2023-05-17 | Discharge: 2023-05-17 | Payer: MEDICARE

## 2023-05-19 ENCOUNTER — Encounter: Admit: 2023-05-19 | Discharge: 2023-05-19 | Payer: MEDICARE

## 2023-05-20 MED FILL — DUPIXENT PEN 300 MG/2 ML SC PNIJ: 3002 mg/2 mL | SUBCUTANEOUS | 28 days supply | Qty: 4 | Fill #3 | Status: AC

## 2023-06-05 ENCOUNTER — Encounter: Admit: 2023-06-05 | Discharge: 2023-06-05 | Payer: MEDICARE

## 2023-06-07 ENCOUNTER — Encounter: Admit: 2023-06-07 | Discharge: 2023-06-07 | Payer: MEDICARE

## 2023-06-10 ENCOUNTER — Encounter: Admit: 2023-06-10 | Discharge: 2023-06-10 | Payer: MEDICARE

## 2023-06-11 ENCOUNTER — Encounter: Admit: 2023-06-11 | Discharge: 2023-06-11 | Payer: MEDICARE

## 2023-06-11 MED FILL — DUPIXENT PEN 300 MG/2 ML SC PNIJ: 3002 mg/2 mL | SUBCUTANEOUS | 28 days supply | Qty: 4 | Fill #4 | Status: AC

## 2023-06-27 ENCOUNTER — Encounter: Admit: 2023-06-27 | Discharge: 2023-06-27 | Payer: MEDICARE

## 2023-07-02 ENCOUNTER — Encounter: Admit: 2023-07-02 | Discharge: 2023-07-02 | Payer: MEDICARE

## 2023-07-04 ENCOUNTER — Encounter: Admit: 2023-07-04 | Discharge: 2023-07-04 | Payer: MEDICARE

## 2023-07-04 MED FILL — DUPIXENT PEN 300 MG/2 ML SC PNIJ: 3002 mg/2 mL | SUBCUTANEOUS | 28 days supply | Qty: 4 | Fill #5 | Status: AC

## 2023-07-19 ENCOUNTER — Encounter: Admit: 2023-07-19 | Discharge: 2023-07-19

## 2023-07-25 ENCOUNTER — Encounter: Admit: 2023-07-25 | Discharge: 2023-07-25

## 2023-07-26 ENCOUNTER — Encounter: Admit: 2023-07-26 | Discharge: 2023-07-26 | Payer: MEDICARE

## 2023-07-29 ENCOUNTER — Encounter: Admit: 2023-07-29 | Discharge: 2023-07-29 | Payer: MEDICARE

## 2023-07-29 MED FILL — DUPIXENT PEN 300 MG/2 ML SC PNIJ: 300 mg/2 mL | SUBCUTANEOUS | 28 days supply | Qty: 4 | Fill #6 | Status: AC

## 2023-08-13 ENCOUNTER — Encounter: Admit: 2023-08-13 | Discharge: 2023-08-13 | Payer: MEDICARE

## 2023-08-15 ENCOUNTER — Encounter: Admit: 2023-08-15 | Discharge: 2023-08-15 | Payer: MEDICARE

## 2023-08-19 ENCOUNTER — Encounter: Admit: 2023-08-19 | Discharge: 2023-08-19 | Payer: MEDICARE

## 2023-08-20 ENCOUNTER — Encounter: Admit: 2023-08-20 | Discharge: 2023-08-20 | Payer: MEDICARE

## 2023-08-21 MED FILL — DUPIXENT PEN 300 MG/2 ML SC PNIJ: 300 mg/2 mL | SUBCUTANEOUS | 28 days supply | Qty: 4 | Fill #7 | Status: AC

## 2023-08-23 ENCOUNTER — Encounter: Admit: 2023-08-23 | Discharge: 2023-08-23 | Payer: MEDICARE

## 2023-08-23 MED ORDER — ARNUITY ELLIPTA 100 MCG/ACTUATION IN DSDV
1 | Freq: Every day | RESPIRATORY_TRACT | 11 refills | 30.00000 days | Status: AC
Start: 2023-08-23 — End: ?

## 2023-08-23 NOTE — Telephone Encounter
 Received refill request    Medication name and strength:  Arnuity Ellipta 100 MCG/ACT Inhalation Aerosol Powder Breath Activated     NOT Filled - Routed to Provider Last fill date 10/03/2022 Quantity # 30 days R 11    Patient was last seen on 03/04/2023 and should have been seen on or around 06/04/2023 has scheduled appointment 10/03/2023    LOV She is currently well controlled on Arnuity once daily with albuterol as needed. Arnuity is still costly but is affordable.   - Continue current therapy.     Reason for Review: Manual review    Routed to Provider

## 2023-08-26 ENCOUNTER — Encounter: Admit: 2023-08-26 | Discharge: 2023-08-26 | Payer: MEDICARE

## 2023-08-28 ENCOUNTER — Encounter: Admit: 2023-08-28 | Discharge: 2023-08-28 | Payer: MEDICARE

## 2023-08-29 ENCOUNTER — Encounter: Admit: 2023-08-29 | Discharge: 2023-08-29 | Payer: MEDICARE

## 2023-09-02 ENCOUNTER — Encounter: Admit: 2023-09-02 | Discharge: 2023-09-02 | Payer: MEDICARE

## 2023-09-04 ENCOUNTER — Encounter: Admit: 2023-09-04 | Discharge: 2023-09-04 | Payer: MEDICARE

## 2023-09-05 ENCOUNTER — Encounter: Admit: 2023-09-05 | Discharge: 2023-09-05 | Payer: MEDICARE

## 2023-09-10 ENCOUNTER — Encounter: Admit: 2023-09-10 | Discharge: 2023-09-10 | Payer: MEDICARE

## 2023-09-10 MED ORDER — DUPIXENT PEN 300 MG/2 ML SC PNIJ
300 mg | SUBCUTANEOUS | 6 refills | 28.00000 days | Status: AC
Start: 2023-09-10 — End: ?
  Filled 2023-09-18: qty 4, 28d supply, fill #1

## 2023-09-10 NOTE — Progress Notes
 Pharmacy Benefits Investigation    Medication name: dupilumab (DUPIXENT PEN) 300 mg/2 mL injectable PEN  Medication status: continuation (refill)    The prior authorization renewal was reapproved for [employer] from 09/06/2023 through n/a (no specified end date).        The patient previously stated this copay is affordable. The patient is not due for a refill at this time, the prescription will be filled through the normal refill process.    Shaddai Shapley [facility]  Specialty Pharmacy Patient Advocate

## 2023-09-10 NOTE — Telephone Encounter
 Received refill request    Medication name and strength:  dupilumab (DUPIXENT PEN) 300 mg/2 mL injectable PEN     NOT Filled - Routed to Provider Last fill date 03/04/2023 Quantity # 4ml R 6    Patient was last seen on 03/04/2023 and has follow up scheduled on  10/03/2023    LOV - Will start Dupixent 300 mg q14 days for her polyps and this will likely also have a positive impact on the asthma.     Reason for Review: Non-delegated    Routed to Provider

## 2023-09-11 ENCOUNTER — Encounter: Admit: 2023-09-11 | Discharge: 2023-09-11 | Payer: MEDICARE

## 2023-09-12 ENCOUNTER — Encounter: Admit: 2023-09-12 | Discharge: 2023-09-12 | Payer: MEDICARE

## 2023-09-16 ENCOUNTER — Encounter: Admit: 2023-09-16 | Discharge: 2023-09-16 | Payer: MEDICARE

## 2023-09-17 ENCOUNTER — Encounter: Admit: 2023-09-17 | Discharge: 2023-09-17 | Payer: MEDICARE

## 2023-09-18 ENCOUNTER — Encounter: Admit: 2023-09-18 | Discharge: 2023-09-18 | Payer: MEDICARE

## 2023-10-02 ENCOUNTER — Encounter: Admit: 2023-10-02 | Discharge: 2023-10-02 | Payer: MEDICARE

## 2023-10-03 ENCOUNTER — Encounter: Admit: 2023-10-03 | Discharge: 2023-10-03 | Payer: MEDICARE

## 2023-10-03 ENCOUNTER — Ambulatory Visit: Admit: 2023-10-03 | Discharge: 2023-10-04 | Payer: MEDICARE

## 2023-10-03 DIAGNOSIS — Z889 Allergy status to unspecified drugs, medicaments and biological substances status: Secondary | ICD-10-CM

## 2023-10-03 DIAGNOSIS — R438 Other disturbances of smell and taste: Secondary | ICD-10-CM

## 2023-10-03 DIAGNOSIS — J301 Allergic rhinitis due to pollen: Secondary | ICD-10-CM

## 2023-10-03 DIAGNOSIS — J329 Chronic sinusitis, unspecified: Secondary | ICD-10-CM

## 2023-10-03 DIAGNOSIS — J4540 Moderate persistent asthma, uncomplicated: Secondary | ICD-10-CM

## 2023-10-03 DIAGNOSIS — L508 Other urticaria: Secondary | ICD-10-CM

## 2023-10-03 NOTE — Progress Notes
 Subjective:       History of Present Illness       Morgan Sharp is a 73 y.o. female with chronic urticaria, sinus infections with a history of polyposis, asthma, hyposmia, GERD, allergic rhinitis, allergic contact dermatitis, drug allergy/NSAID allergy who is here for follow up. She was last seen 03/04/23 at which time we started Dupixent.     She can now smell normally. She states she does not feel like she has had any change in her breathing but was not having any issues anyway. She is still on Arnuity and Singulair. She has not had to use her rescue albuterol.   She has not had any sinus infections.   She is still using Flonase but not the Patanase. She is still on Zyrtec BID, going to once per day causes increase in nasal congestion.   There have been no UC/ER/unplanned PCP visits, nocturnal awakenings, or oral steroid use for asthma since the last visit.     She is not having to use the clobetasol or the fluticasone and has not had any skin rash other than occasional hives on her head.        Review of Systems  As per HPI.    Objective:          ARNUITY ELLIPTA 100 mcg/actuation disk inhaler Inhale 1 puff by mouth once daily    atorvastatin (LIPITOR) 10 mg tablet Take one tablet by mouth daily.    Cetirizine (ZYRTEC) 10 mg Cap Take 1 Cap by mouth twice daily.    dupilumab (DUPIXENT PEN) 300 mg/2 mL injectable PEN Inject 2 mL under the skin every 14 days. Indications: chronic rhinosinusitis with nasal polyposis    esomeprazole magnesium (NEXIUM PO) Take  by mouth.    famotidine(+) (PEPCID) 40 mg tablet Take one tablet by mouth twice daily. (Patient taking differently: Take one tablet by mouth three times daily.)    fluticasone propionate (FLONASE) 50 mcg/actuation nasal spray, suspension Apply two sprays to each nostril as directed daily. Shake bottle gently before using.    lisinopril (PRINIVIL; ZESTRIL) 20 mg tablet Take one tablet by mouth daily.      montelukast (SINGULAIR) 10 mg tablet TAKE 1 TABLET BY MOUTH EVERY DAY    VENTOLIN HFA 90 mcg/actuation inhaler Inhale two puffs by mouth into the lungs every 4 hours as needed.     Vitals:    10/03/23 0939   BP: 134/80   BP Source: Arm, Left Upper   Pulse: 77   SpO2: 99%   Weight: 92.1 kg (203 lb)   Height: 162.6 cm (5' 4)     Body mass index is 34.84 kg/m?[name]     Physical Exam  Vitals reviewed.   Constitutional:       Appearance: She is well-developed.   HENT:      Head: Normocephalic and atraumatic.      Right Ear: Tympanic membrane, ear canal and external ear normal.      Left Ear: Tympanic membrane, ear canal and external ear normal.      Nose: Nose normal.      Mouth/Throat:      Pharynx: No oropharyngeal exudate.   Eyes:      General: No scleral icterus.        Right eye: No discharge.         Left eye: No discharge.      Conjunctiva/sclera: Conjunctivae normal.   Cardiovascular:      Rate and Rhythm:  Normal rate and regular rhythm.      Heart sounds: Normal heart sounds. No murmur heard.     No friction rub. No gallop.   Pulmonary:      Effort: Pulmonary effort is normal. No respiratory distress.      Breath sounds: Normal breath sounds. No wheezing or rales.   Chest:      Chest wall: No tenderness.   Skin:     General: Skin is warm and dry.      Findings: No erythema or rash.   Neurological:      Mental Status: She is alert and oriented to person, place, and time.              Spirometry: normal, improved FEV1 from prior to starting Dupixent   Latest Reference Range & Units 03/04/23 10:03 10/03/23 09:51   FVC-Pre L 2.94 (P) 3.07 (P)   FVC-%Pred-pre % 105 (P) 112 (P)   FEV1-Pre L 1.97 (P) 2.07 (P)   FEV1-%Pred-Pre % 91 (P) 98 (P)   FEV1/FVC-Pre % 67 (P) 67 (P)   FEV1FVC-LLN % 66 (P) 66 (P)   FEF2575-%Pred-Pre % 66 (P) 70 (P)   FEF2575-Pre L/sec 1.23 (P) 1.27 (P)     Assessment and Plan:      Problem   Moderate Asthma Without Complication    Has a history of poor control due to history of steroid induced urticaria. AERD, but has hives with ASA and could not get through a desensitization repeatedly.  Also may have had an allergic component as allergy immunotherapy was helpful initially, but then stopped helping her.  She was reversible completely in 2012.  Spirometry 01/09/22: mild obstruction, down from baseline.    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  - Continue current management, noting some improvement in spirometry since starting Dupixent.      Urticaria, Chronic    Since her 92s, worsens with drug exposure (proven with steroids, ASA, allopurinol, and large amounts of Tylenol).  No significant angioedema associated.  No known eosinophilia.  Autoimmune evaluation negative 08/2010.     Rare breakthrough hives on Zyrtec 10 mg twice daily, Pepcid 40 mg three times daily (also needed for reflux), and Singulair 10 mg daily.   - Continue current management.      Recurrent Sinus Infections  Hyposmia    History of nasal polyposis, possibly ASA sensitivity, concerning for AERD.  Previous labs reviewed: IgG, IgM, IgA within normal limits. IgE elevated at 192. No eosinophilia.   Immunodeficiency evaluation was normal and ESR/CRP/ANCAs were also negative 08/2010.  Reports ongoing symptoms for 1 month in November 2024.    Currently symptomatically well controlled on Zyrtec, Singulair, and Flonase. No interval episodes of sinusitis since starting Dupixent. Hyposmia has resolved.   - Continue current management.     Drug Allergy    Oral steroids cause increase in urticaria 1-2 days after starting the medication: medrol, prednisone, fluticasone (Advair), and budesonide (Symbicort).  02/16/11 - we performed testing to steroids, SPT was positive to undiluted Dexamethasone and Methylprednisolone. ID testing positive to Orapred in 1:10 dilution. It proves she has a true IgE mediated reaction.  - She should continue avoidance, tolerates fluticasone topical.    Allopurinol taken by accident caused anaphylaxis with urticaria.     Excedrin did not cause urticaria, but was told to stop by her allergist at the time. She has never taken  any ASA or NSAIDs since her 98s, cannot recall if it affected her asthma.    Tylenol in 3 gm doses or higher causes urticaria.    PCN in her 32s caused her to black out, and on a second event had hives 24-48 hours after her first dose of penicillin.   03/12/17 - penicillin testing had negative SPT results but positive intradermal testing to 2 out of 3 amoxicillin intradermals placed and she broke out in hives. We did not proceed with oral challenge. If the patient ever requires penicillin in the future, we will perform drug desensitization.     Chlorhexidine - diffuse rash after small exposure on the neck, patch testing positive 03/24/21.    - Continue avoidance as noted above.      Allergic Rhinitis    04/06/04 - outside skin testing (Dr. Jiles) was positive to (2+ or greater) timothy, orchard, red typ, rye.  Her ID were positive to mite, feather, Alternaria, Aspergillus, Helminthosporium, Hormodendum, Penicillium, Fusarium, Rhizopus, Pullularia, ragweed, cocklebus, marsh elder, lambs quarters, pigweed, kochia, ash, oak, elm, walnut, hickory, cottonwood, mulberry, birch.  AIT given twice weekly for many years (stopped in 09/2010).    Doing well on current regimen: Flonase 2 SPEN daily, Singulair, and Zyrtec.  - Continue current management.        RTC in 6 months or sooner if needed.    Thank you for allowing us  to participate in the care of this patient.  Please feel free to contact us  if there are any questions or concerns about the patient.     Betti Poling, DO  Associate Professor  Division of Allergy, Immunology, and Rheumatology  Department of Medicine and Department of Pediatrics  [facility]

## 2023-10-04 DIAGNOSIS — Z886 Allergy status to analgesic agent status: Secondary | ICD-10-CM

## 2023-10-04 DIAGNOSIS — J45909 Unspecified asthma, uncomplicated: Secondary | ICD-10-CM

## 2023-10-04 DIAGNOSIS — J339 Nasal polyp, unspecified: Secondary | ICD-10-CM

## 2023-10-09 ENCOUNTER — Encounter: Admit: 2023-10-09 | Discharge: 2023-10-09 | Payer: MEDICARE

## 2023-10-09 NOTE — Progress Notes
 Contacted Morgan Sharp [name] [name] to refill their medication(s) DUPIXENT  PEN 300 MG/2 ML SC PNIJ.    Patient declined a refill because they have enough medication on hand. The patient requested a call back on 10/23/2023.    [name]  [facility]  [phone]

## 2023-10-22 ENCOUNTER — Encounter: Admit: 2023-10-22 | Discharge: 2023-10-22 | Payer: MEDICARE

## 2023-10-28 ENCOUNTER — Encounter: Admit: 2023-10-28 | Discharge: 2023-10-28 | Payer: MEDICARE

## 2023-10-29 ENCOUNTER — Encounter: Admit: 2023-10-29 | Discharge: 2023-10-29 | Payer: MEDICARE

## 2023-10-29 MED FILL — DUPIXENT PEN 300 MG/2 ML SC PNIJ: 300 | SUBCUTANEOUS | 28 days supply | Qty: 4 | Fill #1 | Status: AC

## 2023-11-14 ENCOUNTER — Encounter: Admit: 2023-11-14 | Discharge: 2023-11-14 | Payer: MEDICARE

## 2023-11-19 ENCOUNTER — Encounter: Admit: 2023-11-19 | Discharge: 2023-11-19 | Payer: MEDICARE

## 2023-11-20 ENCOUNTER — Encounter: Admit: 2023-11-20 | Discharge: 2023-11-20 | Payer: MEDICARE

## 2023-11-21 ENCOUNTER — Encounter: Admit: 2023-11-21 | Discharge: 2023-11-21 | Payer: MEDICARE

## 2023-11-22 MED FILL — DUPIXENT PEN 300 MG/2 ML SC PNIJ: 300 | SUBCUTANEOUS | 28 days supply | Qty: 4 | Fill #2 | Status: AC

## 2023-12-06 ENCOUNTER — Encounter: Admit: 2023-12-06 | Discharge: 2023-12-06 | Payer: MEDICARE

## 2023-12-11 ENCOUNTER — Encounter: Admit: 2023-12-11 | Discharge: 2023-12-11 | Payer: MEDICARE

## 2023-12-12 ENCOUNTER — Encounter: Admit: 2023-12-12 | Discharge: 2023-12-12 | Payer: MEDICARE

## 2023-12-13 ENCOUNTER — Encounter: Admit: 2023-12-13 | Discharge: 2023-12-13 | Payer: MEDICARE

## 2023-12-16 ENCOUNTER — Encounter: Admit: 2023-12-16 | Discharge: 2023-12-16 | Payer: MEDICARE

## 2023-12-17 MED FILL — DUPIXENT PEN 300 MG/2 ML SC PNIJ: 300 | SUBCUTANEOUS | 28 days supply | Qty: 4 | Fill #3 | Status: AC

## 2024-01-02 ENCOUNTER — Encounter: Admit: 2024-01-02 | Discharge: 2024-01-02 | Payer: MEDICARE

## 2024-01-06 ENCOUNTER — Encounter: Admit: 2024-01-06 | Discharge: 2024-01-06 | Payer: MEDICARE

## 2024-01-07 ENCOUNTER — Encounter: Admit: 2024-01-07 | Discharge: 2024-01-07 | Payer: MEDICARE

## 2024-01-08 ENCOUNTER — Encounter: Admit: 2024-01-08 | Discharge: 2024-01-08 | Payer: MEDICARE

## 2024-01-10 ENCOUNTER — Encounter: Admit: 2024-01-10 | Discharge: 2024-01-10 | Payer: MEDICARE

## 2024-01-13 ENCOUNTER — Encounter: Admit: 2024-01-13 | Discharge: 2024-01-13 | Payer: MEDICARE

## 2024-01-13 MED FILL — DUPIXENT PEN 300 MG/2 ML SC PNIJ: 300 | SUBCUTANEOUS | 28 days supply | Qty: 4 | Fill #4 | Status: AC

## 2024-01-30 ENCOUNTER — Encounter: Admit: 2024-01-30 | Discharge: 2024-01-30 | Payer: MEDICARE

## 2024-02-03 ENCOUNTER — Encounter: Admit: 2024-02-03 | Discharge: 2024-02-03 | Payer: MEDICARE

## 2024-02-04 ENCOUNTER — Encounter: Admit: 2024-02-04 | Discharge: 2024-02-04 | Payer: MEDICARE

## 2024-02-05 ENCOUNTER — Encounter: Admit: 2024-02-05 | Discharge: 2024-02-05 | Payer: MEDICARE

## 2024-02-05 NOTE — Progress Notes
 Contacted Morgan Sharp to refill their medication(s) DUPIXENT  PEN 300 MG/2 ML SC PNIJ.    We were unable to reach the patient after three attempts. At this time, we will no longer attempt to contact the patient for the refill. The patient may be re-enrolled in the pharmacy refill management program at any time by contacting the pharmacy or refilling their medication. Ambulatory pharmacist notified.      Sable Lesches  [facility]  [phone]

## 2024-03-02 ENCOUNTER — Encounter: Admit: 2024-03-02 | Discharge: 2024-03-02 | Payer: MEDICARE

## 2024-03-03 ENCOUNTER — Encounter: Admit: 2024-03-03 | Discharge: 2024-03-03 | Payer: MEDICARE

## 2024-03-03 MED FILL — DUPIXENT PEN 300 MG/2 ML SC PNIJ: 300 | SUBCUTANEOUS | 28 days supply | Qty: 4 | Fill #5 | Status: AC

## 2024-03-19 ENCOUNTER — Encounter: Admit: 2024-03-19 | Discharge: 2024-03-19 | Payer: MEDICARE

## 2024-03-24 ENCOUNTER — Encounter: Admit: 2024-03-24 | Discharge: 2024-03-24 | Payer: MEDICARE

## 2024-03-25 ENCOUNTER — Encounter: Admit: 2024-03-25 | Discharge: 2024-03-25 | Payer: MEDICARE

## 2024-03-26 ENCOUNTER — Encounter: Admit: 2024-03-26 | Discharge: 2024-03-26 | Payer: MEDICARE

## 2024-03-27 MED FILL — DUPIXENT PEN 300 MG/2 ML SC PNIJ: 300 | SUBCUTANEOUS | 28 days supply | Qty: 4 | Fill #6 | Status: AC

## 2024-04-06 ENCOUNTER — Encounter: Admit: 2024-04-06 | Discharge: 2024-04-06 | Payer: MEDICARE

## 2024-04-10 ENCOUNTER — Encounter: Admit: 2024-04-10 | Discharge: 2024-04-10 | Payer: MEDICARE

## 2024-04-13 ENCOUNTER — Encounter: Admit: 2024-04-13 | Discharge: 2024-04-13 | Payer: MEDICARE

## 2024-04-15 ENCOUNTER — Encounter: Admit: 2024-04-15 | Discharge: 2024-04-15 | Payer: MEDICARE

## 2024-04-15 MED ORDER — DUPIXENT PEN 300 MG/2 ML SC PNIJ
300 mg | SUBCUTANEOUS | 6 refills | 28.00000 days | Status: AC
Start: 2024-04-15 — End: ?
  Filled 2024-04-22: qty 4, 28d supply, fill #0

## 2024-04-15 NOTE — Telephone Encounter [36]
 Received refill request    Medication name and strength:   dupilumab  (DUPIXENT  PEN) 300 mg/2 mL injectable PEN    NOT Filled - Routed to Provider Last fill date 09/10/2023 Quantity # 4mL R 6    Patient was last seen on 10/03/2023 and has follow up scheduled on  04/30/2024.    LOV  Continue current management, noting some improvement in spirometry since starting Dupixent .     Reason for Review: Non-delegated    Routed to Provider

## 2024-04-17 ENCOUNTER — Encounter: Admit: 2024-04-17 | Discharge: 2024-04-17 | Payer: MEDICARE

## 2024-04-20 ENCOUNTER — Encounter: Admit: 2024-04-20 | Discharge: 2024-04-20 | Payer: MEDICARE

## 2024-04-21 ENCOUNTER — Encounter: Admit: 2024-04-21 | Discharge: 2024-04-21 | Payer: MEDICARE

## 2024-04-22 ENCOUNTER — Encounter: Admit: 2024-04-22 | Discharge: 2024-04-22 | Payer: MEDICARE

## 2024-04-30 ENCOUNTER — Ambulatory Visit: Admit: 2024-04-30 | Discharge: 2024-05-01 | Payer: MEDICARE

## 2024-04-30 ENCOUNTER — Encounter: Admit: 2024-04-30 | Discharge: 2024-04-30 | Payer: MEDICARE

## 2024-04-30 VITALS — BP 134/87 | HR 88 | Ht 64.0 in | Wt 195.0 lb

## 2024-04-30 DIAGNOSIS — Z889 Allergy status to unspecified drugs, medicaments and biological substances status: Principal | ICD-10-CM

## 2024-04-30 DIAGNOSIS — Z888 Allergy status to other drugs, medicaments and biological substances status: Secondary | ICD-10-CM

## 2024-04-30 DIAGNOSIS — H9313 Tinnitus, bilateral: Secondary | ICD-10-CM

## 2024-04-30 DIAGNOSIS — J309 Allergic rhinitis, unspecified: Secondary | ICD-10-CM

## 2024-04-30 DIAGNOSIS — J45909 Unspecified asthma, uncomplicated: Secondary | ICD-10-CM

## 2024-04-30 DIAGNOSIS — K219 Gastro-esophageal reflux disease without esophagitis: Secondary | ICD-10-CM

## 2024-04-30 DIAGNOSIS — J329 Chronic sinusitis, unspecified: Secondary | ICD-10-CM

## 2024-04-30 DIAGNOSIS — L508 Other urticaria: Secondary | ICD-10-CM

## 2024-04-30 NOTE — Patient Instructions [37]
 ENT consult for the tinnitus.   Consider the aspirin challenge if you want.

## 2024-04-30 NOTE — Progress Notes [1]
 Subjective:       History of Present Illness  DREONNA HUSSEIN is a 74 y.o. female with chronic urticaria, sinus infections with a history of polyposis, asthma, hyposmia, GERD, allergic rhinitis, allergic contact dermatitis, drug allergy/NSAID allergy who is here for follow up. She was last seen 10/03/23 at which time we started Dupixent .     She is using doing very well on Dupixent  and Arnuity without issues. She has not used her rescue inhaler except for maybe once in the last year. The week before Christmas, she had the flu and had an otitis and sinusitis along with a lot of chest congestion. She went to the doctor and got a Z-pak. She denies problems with her asthma even when she had this flu. She had her flu vaccine in 02/2024. She never got tested but her grandchildren had proven flu A.     She is also taking Zyrtec, Singulair  and Flonase . She is also on famotidine  for hives. She denies breakthrough hives as long as she takes these.     Sense of smell is normal.     She is still avoiding NSAIDs. She has an ID bracelet and has notifications in her car and all her kids know.     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 She has had a recommendation for hearing aids iin [place] with [facility], Dr. Johnie.  It happened when she had the allergic reaction to chlorhexidine. It is worse in the evenings and she has a lot of noise in her ears. She would like another opinion.     She has had her RSV and PCV vaccines.     The patient has had no noteworthy changes in the patient's medical, surgical, family, or social history since their last visit.        Review of Systems  As per HPI.    Objective:          ARNUITY ELLIPTA  100 mcg/actuation disk inhaler Inhale 1 puff by mouth once daily    atorvastatin (LIPITOR) 10 mg tablet Take one tablet by mouth daily.    Cetirizine (ZYRTEC) 10 mg Cap Take 1 Cap by mouth twice daily.    dupilumab  (DUPIXENT  PEN) 300 mg/2 mL injectable PEN Inject 2 mL under the skin every 14 days. Indications: chronic rhinosinusitis with nasal polyposis    esomeprazole magnesium (NEXIUM PO) Take  by mouth.    famotidine (+) (PEPCID ) 40 mg tablet Take one tablet by mouth twice daily. (Patient taking differently: Take one tablet by mouth three times daily.)    fluticasone  propionate (FLONASE ) 50 mcg/actuation nasal spray, suspension Apply two sprays to each nostril as directed daily. Shake bottle gently before using.    lisinopril (PRINIVIL; ZESTRIL) 20 mg tablet Take one tablet by mouth daily.      montelukast  (SINGULAIR ) 10 mg tablet TAKE 1 TABLET BY MOUTH EVERY DAY    VENTOLIN  HFA 90 mcg/actuation inhaler Inhale two puffs by mouth into the lungs every 4 hours as needed.     Vitals:    04/30/24 0933   BP: 134/87   BP Source: Arm, Left Upper   Pulse: 88   SpO2: 99%   Weight: 88.5 kg (195 lb)   Height: 162.6 cm (5' 4)     Body mass index is 33.47 kg/m?[name]     Physical Exam  Vitals reviewed.   Constitutional:       Appearance: She is well-developed.  HENT:      Head: Normocephalic and atraumatic.      Right Ear: Tympanic membrane, ear canal and external ear normal.      Left Ear: Tympanic membrane, ear canal and external ear normal.      Nose: Nose normal.      Mouth/Throat:      Pharynx: No oropharyngeal exudate.   Eyes:      General: No scleral icterus.        Right eye: No discharge.         Left eye: No discharge.      Conjunctiva/sclera: Conjunctivae normal.   Cardiovascular:      Rate and Rhythm: Normal rate and regular rhythm.      Heart sounds: Normal heart sounds. No murmur heard.     No friction rub. No gallop.   Pulmonary:      Effort: Pulmonary effort is normal. No respiratory distress.      Breath sounds: Normal breath sounds. No wheezing or rales.   Chest:      Chest wall: No tenderness.   Skin:     General: Skin is warm and dry.      Findings: No erythema or rash.   Neurological:      Mental Status: She is alert and oriented to person, place, and time. Assessment and Plan:    Problem   Moderate Asthma Without Complication    Has a history of poor control due to history of steroid induced urticaria. AERD, but has hives with ASA and could not get through a desensitization repeatedly.  Also may have had an allergic component as allergy immunotherapy was helpful initially, but then stopped helping her.  She was reversible completely in 2012.  Spirometry 09/2023: normal    She is currently well controlled on Arnuity once daily with albuterol  as needed and Dupixent  (used for CRS with polyps and asthma).  - Continue current therapy.  - Will repeat spirometry at her next visit.     Urticaria, Chronic    Since her 31s, worsens with drug exposure (proven with steroids, ASA, allopurinol, and large amounts of Tylenol).  No significant angioedema associated.  No known eosinophilia.  Autoimmune evaluation negative 08/2010.     Rare breakthrough hives on Zyrtec 10 mg twice daily, Pepcid  40 mg three times daily (also needed for reflux), and Singulair  10 mg daily.  - Continue current therapy.     Recurrent Sinus Infections    History of nasal polyposis, possibly ASA sensitivity, concerning for AERD.  Previous labs reviewed: IgG, IgM, IgA within normal limits. IgE elevated at 192. No eosinophilia.   Immunodeficiency evaluation was normal and ESR/CRP/ANCAs were also negative 08/2010.  Reports ongoing symptoms for 1 month in November 2024.    Currently symptomatically well controlled on Zyrtec, Singulair , and Flonase . One interval episode of sinusitis since starting Dupixent  due to influenza A 03/2024.   - Continue current therapy.     Gerd (Gastroesophageal Reflux Disease)    Currently fairly well controlled on Pepcid  40 mg thrice daily and also alleviated by Tums.  - Continue current management.      Drug Allergy    Oral steroids cause increase in urticaria 1-2 days after starting the medication: medrol, prednisone, fluticasone  (Advair), and budesonide (Symbicort).  02/16/11 - we performed testing to steroids, SPT was positive to undiluted Dexamethasone and Methylprednisolone. ID testing positive to Orapred in 1:10 dilution. It proves she has a true IgE mediated reaction.  - She should  continue avoidance, tolerates fluticasone  topical.    Allopurinol taken by accident caused anaphylaxis with urticaria.     Excedrin did not cause urticaria, but was told to stop by her allergist at the time. She has never taken any ASA or NSAIDs since her 24s, cannot recall if it affected her asthma.    Tylenol in 3 gm doses or higher causes urticaria.    PCN in her 67s caused her to black out, and on a second event had hives 24-48 hours after her first dose of penicillin.   03/12/17 - penicillin testing had negative SPT results but positive intradermal testing to 2 out of 3 amoxicillin intradermals placed and she broke out in hives. We did not proceed with oral challenge. If the patient ever requires penicillin in the future, we will perform drug desensitization.     Chlorhexidine - diffuse rash after small exposure on the neck, patch testing positive 03/24/21.    - We discussed further testing including an ASA 325 mg oral challenge now that she is on Dupixent  because this would be first line treatment in the case of acute chest pain.  We discussed that if she did get ASA, it may cause hives or breathing problems, but this would be a risk/benefit discussion at that time. She will think about the ASA challenge.   - She has an ID bracelet and her family is aware of this.   - Continue avoidance of all the above medications.      Aspirin-Sensitive Asthma With Nasal Polyps    Suspect aspirin-exacerbated respiratory disease with the onset of asthma in 3rd decade of life, nasal polyps, and recurrent sinus infections.  Patient failed  3 different protocols of ASA desensitization on 11/03/10; 11/06/10; 11/08/10.  At this point we can't offer other way of desensitizations.  Strict ASA avoidance is recommended.    No accidental exposures to ASA/NSAIDs since her last visit.   - Continue avoidance.      Allergic Rhinitis  Tinnitus     04/06/04 - outside skin testing (Dr. Jiles) was positive to (2+ or greater) timothy, orchard, red typ, rye.  Her ID were positive to mite, feather, Alternaria, Aspergillus, Helminthosporium, Hormodendum, Penicillium, Fusarium, Rhizopus, Pullularia, ragweed, cocklebus, marsh elder, lambs quarters, pigweed, kochia, ash, oak, elm, walnut, hickory, cottonwood, mulberry, birch.  AIT given twice weekly for many years.    Doing well off IT since June 2012.     Doing well on current regimen: Flonase  2 SPEN daily, Singulair , and Zyrtec.  - Continue current therapy.   - Will refer to ENT for the tinnitus for a second opinion.     RTC in 12 months or sooner if needed.    Thank you for allowing us  to participate in the care of this patient.  Please feel free to contact us  if there are any questions or concerns about the patient.     Betti Poling, DO  Associate Professor  Division of Allergy, Immunology, and Rheumatology  Department of Medicine and Department of Pediatrics  [facility]

## 2024-05-08 ENCOUNTER — Encounter: Admit: 2024-05-08 | Discharge: 2024-05-08 | Payer: MEDICARE

## 2024-05-12 ENCOUNTER — Encounter: Admit: 2024-05-12 | Discharge: 2024-05-12 | Payer: MEDICARE

## 2024-05-13 ENCOUNTER — Encounter: Admit: 2024-05-13 | Discharge: 2024-05-13 | Payer: MEDICARE

## 2024-05-14 ENCOUNTER — Encounter: Admit: 2024-05-14 | Discharge: 2024-05-14 | Payer: MEDICARE

## 2024-05-15 MED FILL — DUPIXENT PEN 300 MG/2 ML SC PNIJ: 300 | SUBCUTANEOUS | 28 days supply | Qty: 4 | Fill #1 | Status: AC
